# Patient Record
Sex: Male | Born: 1995 | Race: Black or African American | Hispanic: No | Marital: Single | Smoking: Former smoker
Health system: Southern US, Community
[De-identification: ages and names within clinical notes are randomized; demographics above are authoritative.]

## PROBLEM LIST (undated history)

## (undated) DIAGNOSIS — G43909 Migraine, unspecified, not intractable, without status migrainosus: Secondary | ICD-10-CM

## (undated) DIAGNOSIS — G4700 Insomnia, unspecified: Secondary | ICD-10-CM

## (undated) DIAGNOSIS — Z8489 Family history of other specified conditions: Secondary | ICD-10-CM

## (undated) HISTORY — PX: WISDOM TOOTH EXTRACTION: SHX21

## (undated) HISTORY — PX: TYMPANOSTOMY TUBE PLACEMENT: SHX32

---
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
## 2015-11-05 ENCOUNTER — Encounter (HOSPITAL_COMMUNITY): Payer: Self-pay | Admitting: Emergency Medicine

## 2015-11-05 ENCOUNTER — Emergency Department (HOSPITAL_COMMUNITY)
Admission: EM | Admit: 2015-11-05 | Discharge: 2015-11-06 | Disposition: A | Discharge status: Home / Self Care | Payer: BC Managed Care – PPO | Attending: Emergency Medicine | Admitting: Emergency Medicine

## 2015-11-05 DIAGNOSIS — S060X9A Concussion with loss of consciousness of unspecified duration, initial encounter: Secondary | ICD-10-CM | POA: Insufficient documentation

## 2015-11-05 DIAGNOSIS — W01198A Fall on same level from slipping, tripping and stumbling with subsequent striking against other object, initial encounter: Secondary | ICD-10-CM | POA: Insufficient documentation

## 2015-11-05 DIAGNOSIS — Z8679 Personal history of other diseases of the circulatory system: Secondary | ICD-10-CM | POA: Diagnosis not present

## 2015-11-05 DIAGNOSIS — Y9289 Other specified places as the place of occurrence of the external cause: Secondary | ICD-10-CM | POA: Insufficient documentation

## 2015-11-05 DIAGNOSIS — R000 Tachycardia, unspecified: Secondary | ICD-10-CM | POA: Insufficient documentation

## 2015-11-05 DIAGNOSIS — Y9389 Activity, other specified: Secondary | ICD-10-CM | POA: Diagnosis not present

## 2015-11-05 DIAGNOSIS — W19XXXA Unspecified fall, initial encounter: Secondary | ICD-10-CM

## 2015-11-05 DIAGNOSIS — Y998 Other external cause status: Secondary | ICD-10-CM | POA: Insufficient documentation

## 2015-11-05 DIAGNOSIS — F1310 Sedative, hypnotic or anxiolytic abuse, uncomplicated: Secondary | ICD-10-CM | POA: Insufficient documentation

## 2015-11-05 DIAGNOSIS — S0990XA Unspecified injury of head, initial encounter: Secondary | ICD-10-CM | POA: Diagnosis present

## 2015-11-05 DIAGNOSIS — F1210 Cannabis abuse, uncomplicated: Secondary | ICD-10-CM | POA: Diagnosis not present

## 2015-11-05 LAB — CBG MONITORING, ED: Glucose-Capillary: 107 mg/dL — ABNORMAL HIGH (ref 65–99)

## 2015-11-05 NOTE — ED Notes (Signed)
Pt states he fell about 2 hrs ago and hit his head  Pt states he was very dizzy when it first happened and now his mother states he has been disoriented and his equilibrium has been off causing him to walk into things  Pt states he tripped over a root and fell but is not sure what he hit his head on

## 2015-11-05 NOTE — ED Provider Notes (Signed)
CSN: [id number]     Arrival date & time 11/05/15  2205 History  By signing my name below, I, Tanda Rockers, attest that this documentation has been prepared under the direction and in the presence of Marlon Pel, PA-C. Electronically Signed: Tanda Rockers, ED Scribe. 11/05/2015. 11:55 PM.   Chief Complaint  Patient presents with  . Fall   LEVEL 5 CAVEAT pt is altered  The history is provided by the patient. No language interpreter was used.    HPI Comments: RION CATALA is a 20 y.o. male who presents to the Emergency Department for ground level fall that occurred earlier today. Pt is unsure what time it occurred but estimates around 3-4 PM (approximately 9 hours ago). He states that he tripped and fell, hitting the right side of his head on a tree stump. He endorses possible LOC but is not sure. He states that his friend's told him that he had delayed response after hitting his head. Family mentions that pt is having slurred speech and is very confused. He is taking an abnormally long time to recall events.  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 Pt does mention that he is sleep deprived from migraine headaches and attributes this to his poor memory tonight. No obvious injury to scalp. Pt denies that he uses drugs or alcohol. Denies SI/HI. Mom said she is aware that he has had migraines and problems with sleeping ongoing for a long time.  Past Medical History  Diagnosis Date  . Migraines    Past Surgical History  Procedure Laterality Date  . Tympanostomy tube placement    . Wisdom tooth extraction     Family History  Problem Relation Age of Onset  . Hypertension Other    Social History  Substance Use Topics  . Smoking status: Never Smoker   . Smokeless tobacco: None  . Alcohol Use: No    Review of Systems  Unable to perform ROS: Mental status change   Allergies  Review of patient's  allergies indicates no known allergies.  Home Medications   Prior to Admission medications   Medication Sig Start Date End Date Taking? Authorizing Provider  ibuprofen (ADVIL,MOTRIN) 600 MG tablet Take 600 mg by mouth every 6 (six) hours as needed for headache.  08/27/15  Yes Historical Provider, MD   BP 126/75 mmHg  Pulse 74  Temp(Src) 97.9 F (36.6 C) (Oral)  Resp 20  SpO2 98%   Physical Exam  Constitutional: He appears well-developed and well-nourished. No distress.  HENT:  Head: Normocephalic and atraumatic.  Eyes: Conjunctivae and EOM are normal.  Neck: Neck supple. No tracheal deviation present.  Cardiovascular: Tachycardia present.   Pulmonary/Chest: Effort normal. No respiratory distress.  Musculoskeletal: Normal range of motion.  Neurological: He is alert. He displays no atrophy. No cranial nerve deficit or sensory deficit.  When asked when pt's birthday is it takes him 3 times to state the proper birthday. When asked to remember the phrases "blue, basketball, and 16" but recalls "blue and telephone."  Skin: Skin is warm and dry.  Psychiatric: He has a normal mood and affect. His behavior is normal.  Nursing note and vitals reviewed.   ED Course  Procedures (including critical care time)  DIAGNOSTIC STUDIES: Oxygen Saturation is 100% on RA, normal by my interpretation.    COORDINATION OF CARE: 11:50 PM-Discussed treatment plan which includes CT Head with pt  at bedside and pt agreed to plan.   Labs Review Labs Reviewed  URINE RAPID DRUG SCREEN, HOSP PERFORMED - Abnormal; Notable for the following:    Benzodiazepines POSITIVE (*)    Tetrahydrocannabinol POSITIVE (*)    All other components within normal limits  CBC WITH DIFFERENTIAL/PLATELET - Abnormal; Notable for the following:    WBC 10.6 (*)    RBC 4.19 (*)    Hemoglobin 12.6 (*)    HCT 37.8 (*)    Neutro Abs 7.9 (*)    All other components within normal limits  BASIC METABOLIC PANEL - Abnormal; Notable  for the following:    Glucose, Bld 114 (*)    All other components within normal limits  CBG MONITORING, ED - Abnormal; Notable for the following:    Glucose-Capillary 107 (*)    All other components within normal limits  ETHANOL    Imaging Review Ct Head Wo Contrast  11/06/2015  CLINICAL DATA:  20 year old male who with migraines. Patient reports recent fall. EXAM: CT HEAD WITHOUT CONTRAST CT CERVICAL SPINE WITHOUT CONTRAST TECHNIQUE: Multidetector CT imaging of the head and cervical spine was performed following the standard protocol without intravenous contrast. Multiplanar CT image reconstructions of the cervical spine were also generated. COMPARISON:  None. FINDINGS: CT HEAD FINDINGS The ventricles and the sulci are appropriate in size for the patient's age. There is no intracranial hemorrhage. No midline shift or mass effect identified. The gray-white matter differentiation is preserved. The visualized paranasal sinuses and mastoid air cells are well aerated. The calvarium is intact. CT CERVICAL SPINE FINDINGS There is no acute fracture or subluxation of the cervical spine.The intervertebral disc spaces are preserved.The odontoid and spinous processes are intact.There is normal anatomic alignment of the C1-C2 lateral masses. The visualized soft tissues appear unremarkable. IMPRESSION: No acute intracranial pathology. No acute/ traumatic cervical spine pathology. Electronically Signed   By: Elgie Collard M.D.   On: 11/06/2015 00:19   Ct Cervical Spine Wo Contrast  11/06/2015  CLINICAL DATA:  20 year old male who with migraines. Patient reports recent fall. EXAM: CT HEAD WITHOUT CONTRAST CT CERVICAL SPINE WITHOUT CONTRAST TECHNIQUE: Multidetector CT imaging of the head and cervical spine was performed following the standard protocol without intravenous contrast. Multiplanar CT image reconstructions of the cervical spine were also generated. COMPARISON:  None. FINDINGS: CT HEAD FINDINGS The  ventricles and the sulci are appropriate in size for the patient's age. There is no intracranial hemorrhage. No midline shift or mass effect identified. The gray-white matter differentiation is preserved. The visualized paranasal sinuses and mastoid air cells are well aerated. The calvarium is intact. CT CERVICAL SPINE FINDINGS There is no acute fracture or subluxation of the cervical spine.The intervertebral disc spaces are preserved.The odontoid and spinous processes are intact.There is normal anatomic alignment of the C1-C2 lateral masses. The visualized soft tissues appear unremarkable. IMPRESSION: No acute intracranial pathology. No acute/ traumatic cervical spine pathology. Electronically Signed   By: Elgie Collard M.D.   On: 11/06/2015 00:19   I have personally reviewed and evaluated these images and lab results as part of my medical decision-making.   EKG Interpretation None      MDM   Final diagnoses:  Fall, initial encounter  Concussion, with loss of consciousness of unspecified duration, initial encounter   Patient back to baseline at 2:30 am Drug screen has come back showing positive Benzos and Marijuana, The patient admits to not long before the fall smoking a few bowls of marijuana and  taking a bar of Xanax for the first time which he used because him and his friend were bored.  He then fell and went home and his mom was concerned of his decrease memory and slurring his speech, he now has no deficits. He requests I don't tell his mother who is with him. Results and conversation was had after asking grandpa and mother to step out of the room.  Patient encouraged to dc using drugs.Clinically patient may still have some mild concussion symptoms. Given strict return precautions.  Filed Vitals:   11/06/15 0046 11/06/15 0248  BP: 134/78 126/75  Pulse: 82 74  Temp:    Resp: 18 20     I personally performed the services described in this documentation, which was scribed in my  presence. The recorded information has been reviewed and is accurate.      Marlon Peliffany Makenzee Choudhry, PA-C 11/06/15 0254  Dione Boozeavid Glick, MD 11/06/15 [phone]

## 2015-11-06 ENCOUNTER — Emergency Department (HOSPITAL_COMMUNITY): Payer: BC Managed Care – PPO

## 2015-11-06 LAB — CBC WITH DIFFERENTIAL/PLATELET
Basophils Absolute: 0 10*3/uL (ref 0.0–0.1)
Basophils Relative: 0 %
Eosinophils Absolute: 0.2 10*3/uL (ref 0.0–0.7)
Eosinophils Relative: 2 %
HCT: 37.8 % — ABNORMAL LOW (ref 39.0–52.0)
Hemoglobin: 12.6 g/dL — ABNORMAL LOW (ref 13.0–17.0)
Lymphocytes Relative: 18 %
Lymphs Abs: 1.9 10*3/uL (ref 0.7–4.0)
MCH: 30.1 pg (ref 26.0–34.0)
MCHC: 33.3 g/dL (ref 30.0–36.0)
MCV: 90.2 fL (ref 78.0–100.0)
Monocytes Absolute: 0.6 10*3/uL (ref 0.1–1.0)
Monocytes Relative: 6 %
Neutro Abs: 7.9 10*3/uL — ABNORMAL HIGH (ref 1.7–7.7)
Neutrophils Relative %: 74 %
Platelets: 283 10*3/uL (ref 150–400)
RBC: 4.19 MIL/uL — ABNORMAL LOW (ref 4.22–5.81)
RDW: 13.8 % (ref 11.5–15.5)
WBC: 10.6 10*3/uL — ABNORMAL HIGH (ref 4.0–10.5)

## 2015-11-06 LAB — BASIC METABOLIC PANEL
Anion gap: 9 (ref 5–15)
BUN: 11 mg/dL (ref 6–20)
CO2: 26 mmol/L (ref 22–32)
Calcium: 9.3 mg/dL (ref 8.9–10.3)
Chloride: 103 mmol/L (ref 101–111)
Creatinine, Ser: 0.82 mg/dL (ref 0.61–1.24)
GFR calc Af Amer: >60 mL/min (ref >60)
GFR calc non Af Amer: >60 mL/min (ref >60)
Glucose, Bld: 114 mg/dL — ABNORMAL HIGH (ref 65–99)
Potassium: 3.7 mmol/L (ref 3.5–5.1)
Sodium: 138 mmol/L (ref 135–145)

## 2015-11-06 LAB — RAPID URINE DRUG SCREEN, HOSP PERFORMED
Amphetamines: NOT DETECTED
Barbiturates: NOT DETECTED
Benzodiazepines: POSITIVE — AB
Cocaine: NOT DETECTED
Opiates: NOT DETECTED
Tetrahydrocannabinol: POSITIVE — AB

## 2015-11-06 LAB — ETHANOL: Alcohol, Ethyl (B): <5 mg/dL (ref <5)

## 2015-11-06 NOTE — Discharge Instructions (Signed)
Concussion, Adult A concussion, or closed-head injury, is a brain injury caused by a direct blow to the head or by a quick and sudden movement (jolt) of the head or neck. Concussions are usually not life-threatening. Even so, the effects of a concussion can be serious. If you have had a concussion before, you are more likely to experience concussion-like symptoms after a direct blow to the head.  CAUSES  Direct blow to the head, such as from running into another player during a soccer game, being hit in a fight, or hitting your head on a hard surface.  A jolt of the head or neck that causes the brain to move back and forth inside the skull, such as in a car crash. SIGNS AND SYMPTOMS The signs of a concussion can be hard to notice. Early on, they may be missed by you, family members, and health care providers. You may look fine but act or feel differently. Symptoms are usually temporary, but they may last for days, weeks, or even longer. Some symptoms may appear right away while others may not show up for hours or days. Every head injury is different. Symptoms include:  Mild to moderate headaches that will not go away.  A feeling of pressure inside your head.  Having more trouble than usual:  Learning or remembering things you have heard.  Answering questions.  Paying attention or concentrating.  Organizing daily tasks.  Making decisions and solving problems.  Slowness in thinking, acting or reacting, speaking, or reading.  Getting lost or being easily confused.  Feeling tired all the time or lacking energy (fatigued).  Feeling drowsy.  Sleep disturbances.  Sleeping more than usual.  Sleeping less than usual.  Trouble falling asleep.  Trouble sleeping (insomnia).  Loss of balance or feeling lightheaded or dizzy.  Nausea or vomiting.  Numbness or tingling.  Increased sensitivity to:  Sounds.  Lights.  Distractions.  Vision problems or eyes that tire  easily.  Diminished sense of taste or smell.  Ringing in the ears.  Mood changes such as feeling sad or anxious.  Becoming easily irritated or angry for little or no reason.  Lack of motivation.  Seeing or hearing things other people do not see or hear (hallucinations). DIAGNOSIS Your health care provider can usually diagnose a concussion based on a description of your injury and symptoms. He or she will ask whether you passed out (lost consciousness) and whether you are having trouble remembering events that happened right before and during your injury. Your evaluation might include:  A brain scan to look for signs of injury to the brain. Even if the test shows no injury, you may still have a concussion.  Blood tests to be sure other problems are not present. TREATMENT  Concussions are usually treated in an emergency department, in urgent care, or at a clinic. You may need to stay in the hospital overnight for further treatment.  Tell your health care provider if you are taking any medicines, including prescription medicines, over-the-counter medicines, and natural remedies. Some medicines, such as blood thinners (anticoagulants) and aspirin, may increase the chance of complications. Also tell your health care provider whether you have had alcohol or are taking illegal drugs. This information may affect treatment.  Your health care provider will send you home with important instructions to follow.  How fast you will recover from a concussion depends on many factors. These factors include how severe your concussion is, what part of your brain was injured,  your age, and how healthy you were before the concussion. °· Most people with mild injuries recover fully. Recovery can take time. In general, recovery is slower in older persons. Also, persons who have had a concussion in the past or have other medical problems may find that it takes longer to recover from their current injury. °HOME  CARE INSTRUCTIONS °General Instructions °· Carefully follow the directions your health care provider gave you. °· Only take over-the-counter or prescription medicines for pain, discomfort, or fever as directed by your health care provider. °· Take only those medicines that your health care provider has approved. °· Do not drink alcohol until your health care provider says you are well enough to do so. Alcohol and certain other drugs may slow your recovery and can put you at risk of further injury. °· If it is harder than usual to remember things, write them down. °· If you are easily distracted, try to do one thing at a time. For example, do not try to watch TV while fixing dinner. °· Talk with family members or close friends when making important decisions. °· Keep all follow-up appointments. Repeated evaluation of your symptoms is recommended for your recovery. °· Watch your symptoms and tell others to do the same. Complications sometimes occur after a concussion. Older adults with a brain injury may have a higher risk of serious complications, such as a blood clot on the brain. °· Tell your teachers, school nurse, school counselor, coach, athletic trainer, or work manager about your injury, symptoms, and restrictions. Tell them about what you can or cannot do. They should watch for: °¨ Increased problems with attention or concentration. °¨ Increased difficulty remembering or learning new information. °¨ Increased time needed to complete tasks or assignments. °¨ Increased irritability or decreased ability to cope with stress. °¨ Increased symptoms. °· Rest. Rest helps the brain to heal. Make sure you: °¨ Get plenty of sleep at night. Avoid staying up late at night. °¨ Keep the same bedtime hours on weekends and weekdays. °¨ Rest during the day. Take daytime naps or rest breaks when you feel tired. °· Limit activities that require a lot of thought or concentration. These include: °¨ Doing homework or job-related  work. °¨ Watching TV. °¨ Working on the computer. °· Avoid any situation where there is potential for another head injury (football, hockey, soccer, basketball, martial arts, downhill snow sports and horseback riding). Your condition will get worse every time you experience a concussion. You should avoid these activities until you are evaluated by the appropriate follow-up health care providers. °Returning To Your Regular Activities °You will need to return to your normal activities slowly, not all at once. You must give your body and brain enough time for recovery. °· Do not return to sports or other athletic activities until your health care provider tells you it is safe to do so. °· Ask your health care provider when you can drive, ride a bicycle, or operate heavy machinery. Your ability to react may be slower after a brain injury. Never do these activities if you are dizzy. °· Ask your health care provider about when you can return to work or school. °Preventing Another Concussion °It is very important to avoid another brain injury, especially before you have recovered. In rare cases, another injury can lead to permanent brain damage, brain swelling, or death. The risk of this is greatest during the first 7-10 days after a head injury. Avoid injuries by: °· Wearing a   seat belt when riding in a car.  Drinking alcohol only in moderation.  Wearing a helmet when biking, skiing, skateboarding, skating, or doing similar activities.  Avoiding activities that could lead to a second concussion, such as contact or recreational sports, until your health care provider says it is okay.  Taking safety measures in your home.  Remove clutter and tripping hazards from floors and stairways.  Use grab bars in bathrooms and handrails by stairs.  Place non-slip mats on floors and in bathtubs.  Improve lighting in dim areas. SEEK MEDICAL CARE IF:  You have increased problems paying attention or  concentrating.  You have increased difficulty remembering or learning new information.  You need more time to complete tasks or assignments than before.  You have increased irritability or decreased ability to cope with stress.  You have more symptoms than before. Seek medical care if you have any of the following symptoms for more than 2 weeks after your injury:  Lasting (chronic) headaches.  Dizziness or balance problems.  Nausea.  Vision problems.  Increased sensitivity to noise or light.  Depression or mood swings.  Anxiety or irritability.  Memory problems.  Difficulty concentrating or paying attention.  Sleep problems.  Feeling tired all the time. SEEK IMMEDIATE MEDICAL CARE IF:  You have severe or worsening headaches. These may be a sign of a blood clot in the brain.  You have weakness (even if only in one hand, leg, or part of the face).  You have numbness.  You have decreased coordination.  You vomit repeatedly.  You have increased sleepiness.  One pupil is larger than the other.  You have convulsions.  You have slurred speech.  You have increased confusion. This may be a sign of a blood clot in the brain.  You have increased restlessness, agitation, or irritability.  You are unable to recognize people or places.  You have neck pain.  It is difficult to wake you up.  You have unusual behavior changes.  You lose consciousness. MAKE SURE YOU: Head Injury, Adult You have received a head injury. It does not appear serious at this time. Headaches and vomiting are common following head injury. It should be easy to awaken from sleeping. Sometimes it is necessary for you to stay in the emergency department for a while for observation. Sometimes admission to the hospital may be needed. After injuries such as yours, most problems occur within the first 24 hours, but side effects may occur up to 7-10 days after the injury. It is important for you to  carefully monitor your condition and contact your health care provider or seek immediate medical care if there is a change in your condition. WHAT ARE THE TYPES OF HEAD INJURIES? Head injuries can be as minor as a bump. Some head injuries can be more severe. More severe head injuries include:  A jarring injury to the brain (concussion).  A bruise of the brain (contusion). This mean there is bleeding in the brain that can cause swelling.  A cracked skull (skull fracture).  Bleeding in the brain that collects, clots, and forms a bump (hematoma). WHAT CAUSES A HEAD INJURY? A serious head injury is most likely to happen to someone who is in a car wreck and is not wearing a seat belt. Other causes of major head injuries include bicycle or motorcycle accidents, sports injuries, and falls. HOW ARE HEAD INJURIES DIAGNOSED? A complete history of the event leading to the injury and your current symptoms  will be helpful in diagnosing head injuries. Many times, pictures of the brain, such as CT or MRI are needed to see the extent of the injury. Often, an overnight hospital stay is necessary for observation.  WHEN SHOULD I SEEK IMMEDIATE MEDICAL CARE?  You should get help right away if:  You have confusion or drowsiness.  You feel sick to your stomach (nauseous) or have continued, forceful vomiting.  You have dizziness or unsteadiness that is getting worse.  You have severe, continued headaches not relieved by medicine. Only take over-the-counter or prescription medicines for pain, fever, or discomfort as directed by your health care provider.  You do not have normal function of the arms or legs or are unable to walk.  You notice changes in the black spots in the center of the colored part of your eye (pupil).  You have a clear or bloody fluid coming from your nose or ears.  You have a loss of vision. During the next 24 hours after the injury, you must stay with someone who can watch you for the  warning signs. This person should contact local emergency services (911 in the [place] if you have seizures, you become unconscious, or you are unable to wake up. HOW CAN I PREVENT A HEAD INJURY IN THE FUTURE? The most important factor for preventing major head injuries is avoiding motor vehicle accidents. To minimize the potential for damage to your head, it is crucial to wear seat belts while riding in motor vehicles. Wearing helmets while bike riding and playing collision sports (like football) is also helpful. Also, avoiding dangerous activities around the house will further help reduce your risk of head injury.  WHEN CAN I RETURN TO NORMAL ACTIVITIES AND ATHLETICS? You should be reevaluated by your health care provider before returning to these activities. If you have any of the following symptoms, you should not return to activities or contact sports until 1 week after the symptoms have stopped:  Persistent headache.  Dizziness or vertigo.  Poor attention and concentration.  Confusion.  Memory problems.  Nausea or vomiting.  Fatigue or tire easily.  Irritability.  Intolerant of bright lights or loud noises.  Anxiety or depression.  Disturbed sleep. MAKE SURE YOU:   Understand these instructions.  Will watch your condition.  Will get help right away if you are not doing well or get worse.   This information is not intended to replace advice given to you by your health care provider. Make sure you discuss any questions you have with your health care provider.   Document Released: 06/13/2005 Document Revised: 07/04/2014 Document Reviewed: 02/18/2013 Elsevier Interactive Patient Education [employer]   Understand these instructions.  Will watch your condition.  Will get help right away if you are not doing well or get worse.   This information is not intended to replace advice given to you by your health care provider. Make sure you discuss any questions you  have with your health care provider.   Document Released: 09/03/2003 Document Revised: 07/04/2014 Document Reviewed: 01/03/2013 Elsevier Interactive Patient Education [employer]

## 2015-11-06 NOTE — ED Notes (Signed)
Pt made aware of need for urine sample. Pt stated he cannot urinate at this time.

## 2016-11-19 ENCOUNTER — Encounter (HOSPITAL_COMMUNITY): Payer: Self-pay | Admitting: Emergency Medicine

## 2016-11-19 ENCOUNTER — Emergency Department (HOSPITAL_COMMUNITY)
Admission: EM | Admit: 2016-11-19 | Discharge: 2016-11-19 | Disposition: A | Discharge status: Home / Self Care | Payer: BC Managed Care – PPO | Attending: Emergency Medicine | Admitting: Emergency Medicine

## 2016-11-19 ENCOUNTER — Emergency Department (HOSPITAL_COMMUNITY): Payer: BC Managed Care – PPO

## 2016-11-19 DIAGNOSIS — S91331A Puncture wound without foreign body, right foot, initial encounter: Secondary | ICD-10-CM | POA: Diagnosis not present

## 2016-11-19 DIAGNOSIS — S99921A Unspecified injury of right foot, initial encounter: Secondary | ICD-10-CM | POA: Diagnosis present

## 2016-11-19 DIAGNOSIS — W268XXA Contact with other sharp object(s), not elsewhere classified, initial encounter: Secondary | ICD-10-CM | POA: Diagnosis not present

## 2016-11-19 DIAGNOSIS — Y929 Unspecified place or not applicable: Secondary | ICD-10-CM | POA: Diagnosis not present

## 2016-11-19 DIAGNOSIS — Y999 Unspecified external cause status: Secondary | ICD-10-CM | POA: Diagnosis not present

## 2016-11-19 DIAGNOSIS — L03115 Cellulitis of right lower limb: Secondary | ICD-10-CM | POA: Diagnosis not present

## 2016-11-19 DIAGNOSIS — Z23 Encounter for immunization: Secondary | ICD-10-CM | POA: Insufficient documentation

## 2016-11-19 DIAGNOSIS — Y939 Activity, unspecified: Secondary | ICD-10-CM | POA: Diagnosis not present

## 2016-11-19 MED ORDER — CIPROFLOXACIN HCL 500 MG PO TABS: 500.0000 mg | ORAL_TABLET | Freq: Two times a day (BID) | ORAL | 0 refills | Status: DC

## 2016-11-19 MED ORDER — LIDOCAINE-EPINEPHRINE (PF) 2 %-1:200000 IJ SOLN
10.0000 mL | Freq: Once | INTRAMUSCULAR | Status: DC
  Filled 2016-11-19: qty 20

## 2016-11-19 MED ORDER — TETANUS-DIPHTH-ACELL PERTUSSIS 5-2.5-18.5 LF-MCG/0.5 IM SUSP
0.5000 mL | Freq: Once | INTRAMUSCULAR | Status: AC
  Administered 2016-11-19: 0.5 mL via INTRAMUSCULAR
  Filled 2016-11-19: qty 0.5

## 2016-11-19 NOTE — ED Notes (Signed)
Pt reports that he stepped on a rusty nail at his job. Pt states the foot hurts to walk on and throbs. Pt has a small spot on the rt foot where the nail went in and a circle of red around the area. Pt denies any puss or other drainage from the puncture site.

## 2016-11-19 NOTE — ED Provider Notes (Signed)
[facility]-EMERGENCY DEPT Provider Note   CSN: [id number] Arrival date & time: 11/19/16  1638  By signing my name below, I, Kyle Hardy, attest that this documentation has been prepared under the direction and in the presence of [address] Kyle Hardy, [place] Electronically Signed: Deland PrettySherilynn Hardy, ED Scribe. 11/19/16. 6:41 PM.  History   Chief Complaint Chief Complaint  Patient presents with  . Foot Pain   The history is provided by the patient and medical records. No language interpreter was used.  Foot Pain  This is a new problem. The current episode started 2 days ago. The problem occurs constantly. The problem has been gradually worsening. Pertinent negatives include no chest pain, no abdominal pain and no shortness of breath. The symptoms are aggravated by standing and walking. The symptoms are relieved by NSAIDs. Treatments tried: ibuprofen. The treatment provided moderate relief.    HPI Comments: Kyle Hardy is an otherwise healthy 21 y.o. male who presents to the Emergency Department complaining of R foot pain after he accidentally stepped on a nail while working on a porch 2 days ago. States that the nail was rusty. He had actually previously stepped on another nail several days before that, and it had healed just fine, so he thought this one would do the same. States he was wearing sneakers when he stepped on the nail. States that over the last two days, his pain has worsened, and he developed swelling, redness, and warmth to the puncture wound on the R foot. He describes his pain as 3/10 intermittent "throbbing" nonradiating plantar right foot pain at the site of the wound, worse with walking/standing, and moderately improved with ibuprofen and using neosporin to the wound. Reports associated redness, warmth, and swelling at the site. Pt denies drainage at the site, red streaking, fevers, chills, chest pain, SOB, abdominal pain, constipation, diarrhea, nausea, vomting, dysuria, hematuria,  arthralgias, numbness, tingling, focal weakness, or any other complaints at this time. His last Tetanus was 2579yrs ago.   Past Medical History:  Diagnosis Date  . Migraines     There are no active problems to display for this patient.   Past Surgical History:  Procedure Laterality Date  . TYMPANOSTOMY TUBE PLACEMENT    . WISDOM TOOTH EXTRACTION         Home Medications    Prior to Admission medications   Medication Sig Start Date End Date Taking? Authorizing Provider  ibuprofen (ADVIL,MOTRIN) 600 MG tablet Take 600 mg by mouth every 6 (six) hours as needed for headache.  08/27/15   [provider]    Family History Family History  Problem Relation Age of Onset  . Hypertension Other     Social History Social History  Substance Use Topics  . Smoking status: Never Smoker  . Smokeless tobacco: Not on file  . Alcohol use No     Allergies   Patient has no known allergies.   Review of Systems Review of Systems  Constitutional: Negative for chills and fever.  Respiratory: Negative for shortness of breath.   Cardiovascular: Negative for chest pain.  Gastrointestinal: Negative for abdominal pain, constipation, diarrhea, nausea and vomiting.  Genitourinary: Negative for dysuria and hematuria.  Musculoskeletal: Positive for joint swelling and myalgias. Negative for arthralgias.  Skin: Positive for color change and wound.  Allergic/Immunologic: Negative for immunocompromised state.  Neurological: Negative for weakness and numbness.  Psychiatric/Behavioral: Negative for confusion.   All other systems reviewed and are negative for acute change except as noted in the HPI.  Physical Exam Updated Vital Signs BP 130/90 (BP Location: Left Arm)   Pulse (!) 101   Temp 98 F (36.7 C) (Oral)   Resp 14   Ht 5\' 8"  (1.727 m)   Wt 160 lb (72.6 kg)   SpO2 99%   BMI 24.33 kg/m   Physical Exam  Constitutional: He is oriented to person, place, and time. Vital signs  are normal. He appears well-developed and well-nourished.  Non-toxic appearance. No distress.  Afebrile, nontoxic, NAD  HENT:  Head: Normocephalic and atraumatic.  Mouth/Throat: Mucous membranes are normal.  Eyes: Conjunctivae and EOM are normal. Right eye exhibits no discharge. Left eye exhibits no discharge.  Neck: Normal range of motion. Neck supple.  Cardiovascular: Normal rate and intact distal pulses.   Pulmonary/Chest: Effort normal. No respiratory distress.  Abdominal: Normal appearance. He exhibits no distension.  Musculoskeletal: Normal range of motion.  See skin exam below  Neurological: He is alert and oriented to person, place, and time. He has normal strength. No sensory deficit.  Skin: Skin is warm, dry and intact. No rash noted. There is erythema.  Plantar aspect of the right foot with mild erythema surrounding a small puncture wound, no drainage, mildly warm to the touch, and mildly swollen; no fluctuance or induration of the tissues. Mild TTP to this area, but otherwise no focal bony TTP of the foot or ankle. No red streaking. Strength and sensation grossly intact, distal pulses intact, compartments soft.  Psychiatric: He has a normal mood and affect.  Nursing note and vitals reviewed.    ED Treatments / Results   DIAGNOSTIC STUDIES: Oxygen Saturation is 99% on RA, normal by my interpretation.   COORDINATION OF CARE: 6:00 PM-Discussed next steps with pt. Pt verbalized understanding and is agreeable with the plan.   Labs (all labs ordered are listed, but only abnormal results are displayed) Labs Reviewed - No data to display  EKG  EKG Interpretation None       Radiology Dg Foot Complete Right  Result Date: 11/19/2016 CLINICAL DATA:  Stepped on a rusty nail 4 days ago, initial encounter EXAM: RIGHT FOOT COMPLETE - 3+ VIEW COMPARISON:  None. FINDINGS: No acute fracture or dislocation is noted. No radiopaque foreign body is seen. Mild soft tissue swelling is  noted inferiorly consistent with the given history. IMPRESSION: No acute bony abnormality noted. Mild soft tissues changes are seen without definitive radiopaque foreign body. Electronically Signed   By: Alcide Clever M.D.   On: 11/19/2016 18:18    Procedures .Marland KitchenIncision and Drainage Date/Time: 11/19/2016 7:32 PM Performed by: Rhona Raider Authorized by: Rhona Raider   Consent:    Consent obtained:  Verbal   Consent given by:  Patient   Risks discussed:  Incomplete drainage, pain and bleeding   Alternatives discussed:  Alternative treatment, referral and observation Location:    Indications for incision and drainage: puncture wound.   Location:  Lower extremity   Lower extremity location:  Foot   Foot location:  R foot Pre-procedure details:    Skin preparation:  Betadine Anesthesia (see MAR for exact dosages):    Anesthesia method:  Local infiltration   Local anesthetic:  Lidocaine 2% WITH epi Procedure type:    Complexity:  Simple Procedure details:    Needle aspiration: no     Incision types:  Single straight   Incision depth:  Subcutaneous   Scalpel blade:  11   Wound management:  Probed and deloculated, irrigated with saline, extensive cleaning and  debrided   Drainage:  Bloody   Drainage amount:  Scant   Wound treatment:  Wound left open   Packing materials:  None Post-procedure details:    Patient tolerance of procedure:  Tolerated well, no immediate complications    (including critical care time)  Medications Ordered in ED Medications  lidocaine-EPINEPHrine (XYLOCAINE W/EPI) 2 %-1:200000 (PF) injection 10 mL (not administered)  Tdap (BOOSTRIX) injection 0.5 mL (0.5 mLs Intramuscular Given 11/19/16 1810)     Initial Impression / Assessment and Plan / ED Course  I have reviewed the triage vital signs and the nursing notes.  Pertinent labs & imaging results that were available during my care of the patient were reviewed by me and considered in my medical  decision making (see chart for details).     21 y.o. male here with puncture wound to R foot, now with erythema and warmth around the wound. Tdap out of date, will update today. On exam, mild erythema and warmth just around the puncture wound, mild swelling, no drainage, no fluctuance or induration, no red streaking. Will plan for opening wound to I&D and allow for improved drainage of the puncture wound; will obtain xray first to ensure no retained FBs. Pt declined wanting pain meds. Will reassess shortly.   7:50 PM Xray negative aside from mild soft tissue swelling as expected with the cellulitis clinically seen. I&D performed and opened wound adequately to allow for proper drainage. Advised warm compresses/soaks, and wound care advised. Will start on cipro for infection management. Advised F/up with PCP in 2-3 days for recheck of wound. Strict return precautions advised. Tylenol/motrin discussed for pain. I explained the diagnosis and have given explicit precautions to return to the ER including for any other new or worsening symptoms. The patient understands and accepts the medical plan as it's been dictated and I have answered their questions. Discharge instructions concerning home care and prescriptions have been given. The patient is STABLE and is discharged to home in good condition.   I personally performed the services described in this documentation, which was scribed in my presence. The recorded information has been reviewed and is accurate.   Final Clinical Impressions(s) / ED Diagnoses   Final diagnoses:  Puncture wound of right foot, initial encounter  Cellulitis of right foot    New Prescriptions New Prescriptions   CIPROFLOXACIN (CIPRO) 500 MG TABLET    Take 1 tablet (500 mg total) by mouth 2 (two) times daily. One po bid x 7 days     [address] [place], [place] 11/19/16 1951    Doug Sou, MD 11/20/16 [phone]

## 2016-11-19 NOTE — ED Notes (Signed)
Patient transported to X-ray 

## 2016-11-19 NOTE — Discharge Instructions (Signed)
Keep wound clean and dry. Apply warm compresses or perform warm water soaks with 1/2 hydrogen peroxide+1/2 warm water solution (do this at least twice daily) to affected area throughout the day. Take antibiotic until it is finished. Alternate between tylenol and motrin, as needed for pain. Use post op shoe and neosporin with a bandage on the area until it closes up on its own. [facility]-3 days for wound recheck and ongoing management of the wound. Monitor area for signs of infection to include, but not limited to: increasing pain, spreading redness, drainage/pus, worsening swelling, or fevers. Return to emergency department for emergent changing or worsening symptoms.

## 2016-11-19 NOTE — ED Notes (Signed)
Bed: [facility]PT3 Expected date:  Expected time:  Means of arrival:  Comments: 

## 2016-11-19 NOTE — ED Triage Notes (Signed)
Patient reports stepping on a rusty nail at work. C/o pain to right foot with puncture wound. Ambulatory to triage.

## 2017-10-25 ENCOUNTER — Encounter: Payer: Self-pay | Admitting: Neurology

## 2017-10-25 ENCOUNTER — Ambulatory Visit: Payer: BC Managed Care – PPO | Admitting: Neurology

## 2017-10-25 ENCOUNTER — Encounter

## 2017-10-25 ENCOUNTER — Other Ambulatory Visit: Payer: Self-pay

## 2017-10-25 DIAGNOSIS — G43019 Migraine without aura, intractable, without status migrainosus: Secondary | ICD-10-CM | POA: Diagnosis not present

## 2017-10-25 NOTE — Progress Notes (Signed)
Reason for visit: Migraine headache  [name] is a 22 y.o. male  History of present illness:  [name] is a 22 year old right-handed black male with a history of migraine headaches since he was 11 or 22 years old.  His usual headache frequency is about 2 a week, but he was just recently placed on propranolol taking 20 mg twice daily, he is actually only taking 20 mg daily.  The patient has had a good improvement in his headache on the medication, he is now only having about 1 headache a week and the headaches are not severe.  Headaches tend to be bifrontal in nature and may be associated with some blurring of vision and some occasional nausea.  He may have photophobia and phonophobia.  The headaches may last anywhere from 6 hours up to 24 hours.  Imitrex has been helpful at the 50 mg dose.  He does drink a lot of caffeinated products, he has 1 or 2 cups of coffee a day, 1 or 2 glasses of tea a day, and at least one caffeinated soft drink daily.  The patient oftentimes wakes up with headaches, his sleep pattern is quite irregular.  The patient indicates that the occasional triggers are certain odors, but usually there is no obvious initiator for the headache.  The patient denies any neck stiffness, speech alteration, or confusion with the headache.  He denies any neck pain.  He indicates that both parents have migraine headache, he has 2 sisters and one brother, and his siblings do not have any troubles with migraine.  The patient did have a CT scan of the brain done previously in 2017 that was unremarkable.  He does occasionally have some sinus drainage but this is not associated with his headache.  Past Medical History:  Diagnosis Date  . Migraines     Past Surgical History:  Procedure Laterality Date  . TYMPANOSTOMY TUBE PLACEMENT    . WISDOM TOOTH EXTRACTION      Family History  Problem Relation Age of Onset  . Hypertension Other   .  Hypertension Mother   . Cancer Maternal Aunt   . Heart failure Maternal Grandmother   . Heart failure Paternal Grandmother     Social history:  reports that he has quit smoking. He has never used smokeless tobacco. He reports that he drinks alcohol. He reports that he does not use drugs.  Medications:  Prior to Admission medications   Medication Sig Start Date End Date Taking? Authorizing Provider  ibuprofen (ADVIL,MOTRIN) 600 MG tablet Take 600 mg by mouth every 6 (six) hours as needed for headache.  08/27/15  Yes [provider]  propranolol (INDERAL) 20 MG tablet Take 20 mg by mouth daily.    Yes [provider]  SUMAtriptan (IMITREX) 50 MG tablet Take 50 mg by mouth every 2 (two) hours as needed for migraine. May repeat in 2 hours if headache persists or recurs.   Yes [provider]  traZODone (DESYREL) 50 MG tablet Take 50-100 mg by mouth at bedtime.   Yes [provider]     No Known Allergies  ROS:  Out of a complete 14 system review of symptoms, the patient complains only of the following symptoms, and all other reviewed systems are negative.  Headache Not enough sleep, decreased energy, racing thoughts Insomnia  Blood pressure 121/73, pulse 75, height  (1.702 m), weight 182 lb 8 oz (82.8 kg).  Physical Exam  General: The patient is alert and cooperative at the time of the examination.  Eyes: Pupils are equal, round, and reactive to light. Discs are flat bilaterally.  Neck: The neck is supple, no carotid bruits are noted.  Respiratory: The respiratory examination is clear.  Cardiovascular: The cardiovascular examination reveals a regular rate and rhythm, no obvious murmurs or rubs are noted.  Neuromuscular: Range move the cervical spine is full, no crepitus is noted in the temporomandibular joints.  Skin: Extremities are without significant edema.  Neurologic Exam  Mental status: The patient is alert and oriented x 3 at  the time of the examination. The patient has apparent normal recent and remote memory, with an apparently normal attention span and concentration ability.  Cranial nerves: Facial symmetry is present. There is good sensation of the face to pinprick and soft touch bilaterally. The strength of the facial muscles and the muscles to head turning and shoulder shrug are normal bilaterally. Speech is well enunciated, no aphasia or dysarthria is noted. Extraocular movements are full. Visual fields are full. The tongue is midline, and the patient has symmetric elevation of the soft palate. No obvious hearing deficits are noted.  Motor: The motor testing reveals 5 over 5 strength of all 4 extremities. Good symmetric motor tone is noted throughout.  Sensory: Sensory testing is intact to pinprick, soft touch, vibration sensation, and position sense on all 4 extremities. No evidence of extinction is noted.  Coordination: Cerebellar testing reveals good finger-nose-finger and heel-to-shin bilaterally.  Gait and station: Gait is normal. Tandem gait is normal. Romberg is negative. No drift is seen.  Reflexes: Deep tendon reflexes are symmetric and normal bilaterally. Toes are downgoing bilaterally.   CT head and cervical 11/06/15:  IMPRESSION: No acute intracranial pathology.  No acute/ traumatic cervical spine pathology.  * CT scan images were reviewed online. I agree with the written report.    Assessment/Plan:  1.  Common migraine headache  The patient is on a very low dose of propranolol taking 20 mg daily and he has gotten benefit with the medication.  He is to increase the dose to a 20 mg twice daily dose, he will take Imitrex if needed.  He will call for any dose adjustments of the medication.  He will follow-up in 6 months.  [name] MD 10/25/2017 9:26 AM  [facility] [address] [place], [place] [address]  Phone [phone] Fax [phone]

## 2017-11-16 ENCOUNTER — Ambulatory Visit: Payer: BC Managed Care – PPO | Admitting: Podiatry

## 2017-11-23 ENCOUNTER — Ambulatory Visit (INDEPENDENT_AMBULATORY_CARE_PROVIDER_SITE_OTHER): Payer: BC Managed Care – PPO

## 2017-11-23 ENCOUNTER — Encounter: Payer: Self-pay | Admitting: Podiatry

## 2017-11-23 ENCOUNTER — Ambulatory Visit: Payer: BC Managed Care – PPO | Admitting: Podiatry

## 2017-11-23 VITALS — BP 129/88 | HR 95 | Resp 16

## 2017-11-23 DIAGNOSIS — M2142 Flat foot [pes planus] (acquired), left foot: Secondary | ICD-10-CM | POA: Diagnosis not present

## 2017-11-23 DIAGNOSIS — M2141 Flat foot [pes planus] (acquired), right foot: Secondary | ICD-10-CM

## 2017-11-23 NOTE — Progress Notes (Signed)
CMFO: 4* RF eversion b/l, deep heel, m/l flange.   UCBL type device. Everfeet  Drop [id number]

## 2017-11-26 NOTE — Progress Notes (Signed)
  Subjective:  Patient ID: Kyle Hardy, male    DOB: 02/04/1996,  MRN: [id number] HPI Chief Complaint  Patient presents with  . Foot Pain    Medial foot bilateral - flat feet, hurts when walking a lot   . New Patient (Initial Visit)    22 y.o. male presents with the above complaint.   ROS: Denies fever chills nausea vomiting muscle aches pains calf pain back pain chest pain shortness of breath headache.  Past Medical History:  Diagnosis Date  . Migraines    Past Surgical History:  Procedure Laterality Date  . TYMPANOSTOMY TUBE PLACEMENT    . WISDOM TOOTH EXTRACTION      Current Outpatient Medications:  .  ibuprofen (ADVIL,MOTRIN) 600 MG tablet, Take 600 mg by mouth every 6 (six) hours as needed for headache. , Disp: , Rfl: 0 .  propranolol (INDERAL) 20 MG tablet, Take 20 mg by mouth daily. , Disp: , Rfl:  .  SUMAtriptan (IMITREX) 50 MG tablet, Take 50 mg by mouth every 2 (two) hours as needed for migraine. May repeat in 2 hours if headache persists or recurs., Disp: , Rfl:  .  traZODone (DESYREL) 50 MG tablet, Take 50-100 mg by mouth at bedtime., Disp: , Rfl:   No Known Allergies Review of Systems Objective:   Vitals:   11/23/17 1629  BP: 129/88  Pulse: 95  Resp: 16    General: Well developed, nourished, in no acute distress, alert and oriented x3   Dermatological: Skin is warm, dry and supple bilateral. Nails x 10 are well maintained; remaining integument appears unremarkable at this time. There are no open sores, no preulcerative lesions, no rash or signs of infection present.  Vascular: Dorsalis Pedis artery and Posterior Tibial artery pedal pulses are 2/4 bilateral with immedate capillary fill time. Pedal hair growth present. No varicosities and no lower extremity edema present bilateral.   Neruologic: Grossly intact via light touch bilateral. Vibratory intact via tuning fork bilateral. Protective threshold with Semmes Wienstein monofilament intact to all pedal  sites bilateral. Patellar and Achilles deep tendon reflexes 2+ bilateral. No Babinski or clonus noted bilateral.   Musculoskeletal: No gross boney pedal deformities bilateral. No pain, crepitus, or limitation noted with foot and ankle range of motion bilateral. Muscular strength 5/5 in all groups tested bilateral.  With his knee straight he has 0 degrees of dorsiflexion at the level of the ankle.  Moderate to severe pes planus flexible in nature.  Gait: Unassisted, Nonantalgic.    Radiographs:  Radiographs taken today demonstrate moderate to severe pes planus.  No acute findings.  Assessment & Plan:   Assessment: Ankle equinus pes planus.  Plan: Discussed etiology pathology conservative versus surgical therapies at this point we will get him into a set of orthotics.  He was casted today.     Max T. HornbeakHyatt, North DakotaDPM

## 2017-12-14 ENCOUNTER — Other Ambulatory Visit: Payer: BC Managed Care – PPO | Admitting: Orthotics

## 2018-04-24 ENCOUNTER — Ambulatory Visit: Payer: BC Managed Care – PPO | Admitting: Nurse Practitioner

## 2019-07-02 ENCOUNTER — Ambulatory Visit: Payer: BC Managed Care – PPO | Attending: Internal Medicine

## 2019-07-02 DIAGNOSIS — Z20822 Contact with and (suspected) exposure to covid-19: Secondary | ICD-10-CM

## 2019-07-03 LAB — NOVEL CORONAVIRUS, NAA: SARS-CoV-2, NAA: NOT DETECTED

## 2019-10-12 ENCOUNTER — Emergency Department (HOSPITAL_COMMUNITY): Payer: BLUE CROSS/BLUE SHIELD

## 2019-10-12 ENCOUNTER — Encounter (HOSPITAL_COMMUNITY): Payer: Self-pay | Admitting: Emergency Medicine

## 2019-10-12 ENCOUNTER — Other Ambulatory Visit: Payer: Self-pay

## 2019-10-12 ENCOUNTER — Emergency Department (HOSPITAL_COMMUNITY)
Admission: EM | Admit: 2019-10-12 | Discharge: 2019-10-13 | Disposition: A | Discharge status: Home / Self Care | Payer: BLUE CROSS/BLUE SHIELD | Attending: Emergency Medicine | Admitting: Emergency Medicine

## 2019-10-12 DIAGNOSIS — Y999 Unspecified external cause status: Secondary | ICD-10-CM | POA: Insufficient documentation

## 2019-10-12 DIAGNOSIS — W010XXA Fall on same level from slipping, tripping and stumbling without subsequent striking against object, initial encounter: Secondary | ICD-10-CM | POA: Insufficient documentation

## 2019-10-12 DIAGNOSIS — Y929 Unspecified place or not applicable: Secondary | ICD-10-CM | POA: Insufficient documentation

## 2019-10-12 DIAGNOSIS — S82145A Nondisplaced bicondylar fracture of left tibia, initial encounter for closed fracture: Secondary | ICD-10-CM | POA: Diagnosis not present

## 2019-10-12 DIAGNOSIS — Y9344 Activity, trampolining: Secondary | ICD-10-CM | POA: Insufficient documentation

## 2019-10-12 DIAGNOSIS — Z79899 Other long term (current) drug therapy: Secondary | ICD-10-CM | POA: Diagnosis not present

## 2019-10-12 DIAGNOSIS — S82142A Displaced bicondylar fracture of left tibia, initial encounter for closed fracture: Secondary | ICD-10-CM

## 2019-10-12 DIAGNOSIS — Z87891 Personal history of nicotine dependence: Secondary | ICD-10-CM | POA: Insufficient documentation

## 2019-10-12 DIAGNOSIS — S80912A Unspecified superficial injury of left knee, initial encounter: Secondary | ICD-10-CM | POA: Diagnosis present

## 2019-10-12 HISTORY — DX: Insomnia, unspecified: G47.00

## 2019-10-12 MED ORDER — OXYCODONE-ACETAMINOPHEN 5-325 MG PO TABS: 1.0000 | ORAL_TABLET | Freq: Four times a day (QID) | ORAL | 0 refills | Status: DC | PRN

## 2019-10-12 NOTE — Discharge Instructions (Addendum)
Call Dr. Austin Miles office Monday morning for an appointment.

## 2019-10-12 NOTE — ED Provider Notes (Signed)
[facility] EMERGENCY DEPARTMENT Provider Note   CSN: [id number] Arrival date & time: 10/12/19  1629     History Chief Complaint  Patient presents with  . Knee Pain    Kyle Hardy is a 24 y.o. male.  HPI  Patient presents to the emergency department with left knee injury that occurred just prior to arrival.  Patient states that he was jumping on a trampoline when he jumped and went straight off the edge.  He landed directly on his knee and it gave way.  Patient states that he was unable to bear weight after the injury.  Patient states that certain movements palpation make the pain worse.  Patient denies any numbness, weakness, dizziness, headache, blurred vision, back pain or syncope.  Past Medical History:  Diagnosis Date  . Insomnia   . Migraines     Patient Active Problem List   Diagnosis Date Noted  . Common migraine with intractable migraine 10/25/2017    Past Surgical History:  Procedure Laterality Date  . TYMPANOSTOMY TUBE PLACEMENT    . WISDOM TOOTH EXTRACTION         Family History  Problem Relation Age of Onset  . Hypertension Other   . Hypertension Mother   . Cancer Maternal Aunt   . Heart failure Maternal Grandmother   . Heart failure Paternal Grandmother     Social History   Tobacco Use  . Smoking status: Former Research scientist (life sciences)  . Smokeless tobacco: Never Used  Substance Use Topics  . Alcohol use: Yes    Comment: Drinks on the weekends  . Drug use: Yes    Types: Marijuana    Home Medications Prior to Admission medications   Medication Sig Start Date End Date Taking? Authorizing Provider  ibuprofen (ADVIL,MOTRIN) 600 MG tablet Take 600 mg by mouth every 6 (six) hours as needed for headache.  08/27/15   [provider]  propranolol (INDERAL) 20 MG tablet Take 20 mg by mouth daily.     [provider]  SUMAtriptan (IMITREX) 50 MG tablet Take 50 mg by mouth every 2 (two) hours as needed for migraine. May repeat  in 2 hours if headache persists or recurs.    [provider]  traZODone (DESYREL) 50 MG tablet Take 50-100 mg by mouth at bedtime.    [provider]    Allergies    Patient has no known allergies.  Review of Systems   Review of Systems All other systems negative except as documented in the HPI. All pertinent positives and negatives as reviewed in the HPI. Physical Exam Updated Vital Signs BP 133/75 (BP Location: Right Arm)   Pulse (!) 102   Temp 98.9 F (37.2 C) (Oral)   Resp 20   SpO2 100%   Physical Exam Vitals and nursing note reviewed.  Constitutional:      General: He is not in acute distress.    Appearance: He is well-developed.  HENT:     Head: Normocephalic and atraumatic.  Eyes:     Pupils: Pupils are equal, round, and reactive to light.  Pulmonary:     Effort: Pulmonary effort is normal.  Musculoskeletal:     Left knee: Swelling, effusion, ecchymosis and bony tenderness present. Decreased range of motion. Tenderness present.  Skin:    General: Skin is warm and dry.  Neurological:     Mental Status: He is alert and oriented to person, place, and time.     ED Results / Procedures /  Treatments   Labs (all labs ordered are listed, but only abnormal results are displayed) Labs Reviewed - No data to display  EKG None  Radiology CT Knee Left Wo Contrast  Result Date: 10/12/2019 CLINICAL DATA:  Left knee pain since jumping off a trampoline yesterday. Multiple fractures with a large joint effusion on radiographs earlier today. EXAM: CT OF THE LEFT KNEE WITHOUT CONTRAST TECHNIQUE: Multidetector CT imaging of the left knee was performed according to the standard protocol. Multiplanar CT image reconstructions were also generated. COMPARISON:  Left knee radiographs obtained earlier today. FINDINGS: Severely comminuted central tibial fracture involving the tibial spines with multiple fragments displaced into the joint. These include the attachment of  anterior cruciate ligament. There is also a small avulsion fracture off the lateral aspect of the lateral tibial plateau. There is an associated moderate-sized effusion. No additional fractures are seen. IMPRESSION: 1. Severely comminuted central tibial fracture involving the tibial spines with multiple intra-articular fragments, including avulsion of the attachment of the anterior cruciate ligament. 2. Small avulsion fracture off the lateral aspect of the lateral tibial plateau. 3. Moderate-sized effusion. Electronically Signed   By: Beckie Salts M.D.   On: 10/12/2019 18:59   DG Knee Complete 4 Views Left  Result Date: 10/12/2019 CLINICAL DATA:  24 year old male with trauma to the left knee. EXAM: LEFT KNEE - COMPLETE 4+ VIEW COMPARISON:  None FINDINGS: There is a displaced fracture of the posterior aspect of the medial tibial plateau and mildly displaced fracture of the tibial spine. CT may provide better evaluation of these fractures. There is cortical discontinuity of the lateral aspect of the lateral tibial plateau concerning for fracture. There is no dislocation. There is a large suprapatellar effusion. The soft tissues are grossly unremarkable. IMPRESSION: 1. Displaced fractures of the medial tibial plateau and tibial spine as well as probable cortical fracture of the lateral tibial plateau. 2. Large joint effusion. Electronically Signed   By: Elgie Collard M.D.   On: 10/12/2019 17:46    Procedures Procedures (including critical care time)  Medications Ordered in ED Medications - No data to display  ED Course  I have reviewed the triage vital signs and the nursing notes.  Pertinent labs & imaging results that were available during my care of the patient were reviewed by me and considered in my medical decision making (see chart for details).  Clinical Course as of Oct 11 2001  Sat Oct 12, 2019  1851 24 yo healthy male here with mechanical injury to left knee jumping off a trampoline  yesterday. "my leg went one way and my knee went the other when I landed."  Has swelling and tenderness around the left knee.  Hasn't been able to bear weight.  Neurovascularly intact in his distal leg.  We spoke to Dr Susa Simmonds of orthopedics as there appears to be a tibial plateau fracture, he recommended CT imaging, we are awaiting f/u recs.  Anticipate likely discharge home in knee immobilizer with outpatient f/u.  Patient's pain is under control   [MT]    Clinical Course User Index [MT] Trifan, Kermit Balo, MD   MDM Rules/Calculators/A&P                      Spoke with orthopedics and the evaluate the patient and his CT scan imaging.  The best place him in a knee immobilizer and have him follow-up with Dr. Everardo Pacific of sports medicine calling his office for an appointment.  The patient  is stable and will be discharged home.  Patient agrees the plan and all questions were answered.  The patient has normal compartments on examination.  He has normal sensation and pulses in that extremity as well. Final Clinical Impression(s) / ED Diagnoses Final diagnoses:  None    Rx / DC Orders ED Discharge Orders    None       Charlestine Night, PA-C 10/13/19 2359    Terald Sleeper, MD 10/14/19 1217

## 2019-10-12 NOTE — ED Triage Notes (Signed)
C/o L knee pain since he jumped off a trampoline yesterday.

## 2019-10-12 NOTE — Progress Notes (Signed)
Orthopedic Tech Progress Note Patient Details:  Kyle Hardy [birth year] [id number]  Ortho Devices Type of Ortho Device: Knee Immobilizer Ortho Device/Splint Location: lle. pt did not want crutches as he already had some at home. Ortho Device/Splint Interventions: Ordered, Application, Adjustment   Post Interventions Patient Tolerated: Well Instructions Provided: Care of device, Adjustment of device   Trinna Post 10/12/2019, 9:41 PM

## 2019-10-15 ENCOUNTER — Other Ambulatory Visit (HOSPITAL_COMMUNITY)
Admission: RE | Admit: 2019-10-15 | Discharge: 2019-10-15 | Disposition: A | Discharge status: Home / Self Care | Payer: BC Managed Care – PPO | Source: Ambulatory Visit | Attending: Orthopaedic Surgery | Admitting: Orthopaedic Surgery

## 2019-10-15 DIAGNOSIS — Z20822 Contact with and (suspected) exposure to covid-19: Secondary | ICD-10-CM | POA: Insufficient documentation

## 2019-10-15 DIAGNOSIS — Z01812 Encounter for preprocedural laboratory examination: Secondary | ICD-10-CM | POA: Diagnosis not present

## 2019-10-15 LAB — SARS CORONAVIRUS 2 (TAT 6-24 HRS): SARS Coronavirus 2: NEGATIVE

## 2019-10-16 ENCOUNTER — Encounter (HOSPITAL_BASED_OUTPATIENT_CLINIC_OR_DEPARTMENT_OTHER): Payer: Self-pay | Admitting: Orthopaedic Surgery

## 2019-10-16 ENCOUNTER — Other Ambulatory Visit: Payer: Self-pay

## 2019-10-16 NOTE — H&P (Signed)
PREOPERATIVE H&P  Chief Complaint: LEFT ACL TEAR. LATERAL MENISCUS TEAR, TIBIAL PLATEAU FRACTURE  HPI: Kyle Hardy is a 24 y.o. male who is scheduled for LEFT KNEE ARTHROSCOPY WITH ANTERIOR CRUCIATE LIGAMENT (ACL) REPAIR AND TIBIAL PLATEAU BICONDYLAR INTERAL FIXATION KNEE ARTHROSCOPY WITH LATERAL MENISCUS REPAIR.   Patient has a past medical history significant for insomnia.   This is a 24 year old who had an injury when jumping off a trampoline.  His leg folded when it hit the ground.  He had a CT scan, which demonstrated a complex tibial spine avulsion type fracture.  He is having moderate pain.  He is unaccompanied today.  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    His symptoms are rated as moderate to severe, and have been worsening.  This is significantly impairing activities of daily living.    Please see clinic note for further details on this patient's care.    He has elected for surgical management.   Past Medical History:  Diagnosis Date  . Insomnia   . Migraines    Past Surgical History:  Procedure Laterality Date  . TYMPANOSTOMY TUBE PLACEMENT    . WISDOM TOOTH EXTRACTION     Social History   Socioeconomic History  . Marital status: Single    Spouse name: Not on file  . Number of children: 0  . Years of education: 91  . Highest education level: Not on file  Occupational History  . Occupation: [employer]  Tobacco Use  . Smoking status: Former Smoker    Packs/day: 0.25  . Smokeless tobacco: Never Used  Substance and Sexual Activity  . Alcohol use: Yes    Alcohol/week: 2.0 standard drinks    Types: 2 Glasses of wine per week    Comment: Drinks on the weekends  . Drug use: Yes    Types: Marijuana    Comment: twice daily  . Sexual activity: Not on file  Other Topics Concern  . Not on file  Social History Narrative   Lives w/ mother and sister   Caffeine use: Drinks 1 cup per day   Right handed    Social Determinants of Health    Financial Resource Strain:   . Difficulty of Paying Living Expenses:   Food Insecurity:   . Worried About Programme researcher, broadcasting/film/video in the Last Year:   . Barista in the Last Year:   Transportation Needs:   . Freight forwarder (Medical):   Marland Kitchen Lack of Transportation (Non-Medical):   Physical Activity:   . Days of Exercise per Week:   . Minutes of Exercise per Session:   Stress:   . Feeling of Stress :   Social Connections:   . Frequency of Communication with Friends and Family:   . Frequency of Social Gatherings with Friends and Family:   . Attends Religious Services:   . Active Member of Clubs or Organizations:   . Attends Banker Meetings:   Marland Kitchen Marital Status:    Family History  Problem Relation Age of Onset  . Hypertension Other   . Hypertension Mother   . Cancer Maternal Aunt   . Heart failure Maternal Grandmother   . Heart failure Paternal Grandmother    No Known Allergies Prior to Admission medications   Medication Sig Start Date End Date Taking? Authorizing Provider  ibuprofen (ADVIL,MOTRIN) 600 MG tablet Take 600 mg by mouth Hardy 6 (six) hours as needed for headache.  08/27/15  Yes [provider]  oxyCODONE-acetaminophen (PERCOCET/ROXICET) 5-325 MG tablet Take 1 tablet by mouth Hardy 6 (six) hours as needed for severe pain. 10/12/19  Yes Lawyer, Harrell Gave, PA-C  propranolol (INDERAL) 20 MG tablet Take 20 mg by mouth daily.    Yes [provider]  SUMAtriptan (IMITREX) 50 MG tablet Take 50 mg by mouth Hardy 2 (two) hours as needed for migraine. May repeat in 2 hours if headache persists or recurs.   Yes [provider]  traZODone (DESYREL) 50 MG tablet Take 50-100 mg by mouth at bedtime.   Yes [provider]    ROS: All other systems have been reviewed and were otherwise negative with the exception of those mentioned in the HPI and as above.  Physical Exam: General: Alert, no acute distress Cardiovascular: No  pedal edema Respiratory: No cyanosis, no use of accessory musculature GI: No organomegaly, abdomen is soft and non-tender Skin: No lesions in the area of chief complaint Neurologic: Sensation intact distally Psychiatric: Patient is competent for consent with normal mood and affect Lymphatic: No axillary or cervical lymphadenopathy  MUSCULOSKELETAL:  Left knee: ROM is limited secondary to 3+ effusion and pain.  Nontender to palpation about the joint line.  He has pain with attempts at Lachman.  Soft end point on Lachman noted.  He has a warm and well  perfused extremity otherwise.    Imaging: CT and x-ray are reviewed and demonstrate a complex tibial spine avulsion type fracture with involvement of the medial and lateral tibial plateaus, and likely involvement of the meniscus as well.  He has likely an ACL insufficiency from this, PCL seems to be okay based on the position of his tibial spine fracture.    Assessment: LEFT ACL TEAR. LATERAL MENISCUS TEAR, TIBIAL PLATEAU FRACTURE  Plan: Plan for Procedure(s): LEFT KNEE ARTHROSCOPY WITH ANTERIOR CRUCIATE LIGAMENT (ACL) REPAIR AND TIBIAL PLATEAU BICONDYLAR INTERAL FIXATION KNEE ARTHROSCOPY WITH LATERAL MENISCUS REPAIR  The risks benefits and alternatives were discussed with the patient including but not limited to the risks of nonoperative treatment, versus surgical intervention including infection, bleeding, nerve injury,  blood clots, cardiopulmonary complications, morbidity, mortality, among others, and they were willing to proceed.   He understands specific risks, including stiffness, infection, and the need for later ACL reconstruction.  The patient acknowledged the explanation, agreed to proceed with the plan and consent was signed.   Operative Plan: Left knee scope with tibial spine repair, ACL repair versus reconstruction, and lateral meniscus repair versus meniscectomy  Discharge Medications: Standard DVT Prophylaxis:  Aspirin Physical Therapy: Outpatient PT Special Discharge needs: Knee immobilizer (patient should bring Bledsoe brace with him to surgery)   Ethelda Chick, PA-C  10/16/2019 3:47 PM

## 2019-10-16 NOTE — Progress Notes (Signed)

## 2019-10-17 ENCOUNTER — Encounter (HOSPITAL_BASED_OUTPATIENT_CLINIC_OR_DEPARTMENT_OTHER): Payer: Self-pay | Admitting: Orthopaedic Surgery

## 2019-10-17 ENCOUNTER — Ambulatory Visit (HOSPITAL_BASED_OUTPATIENT_CLINIC_OR_DEPARTMENT_OTHER)
Admission: RE | Admit: 2019-10-17 | Discharge: 2019-10-17 | Disposition: A | Discharge status: Home / Self Care | Payer: BC Managed Care – PPO | Attending: Orthopaedic Surgery | Admitting: Orthopaedic Surgery

## 2019-10-17 ENCOUNTER — Ambulatory Visit (HOSPITAL_BASED_OUTPATIENT_CLINIC_OR_DEPARTMENT_OTHER): Payer: BC Managed Care – PPO | Admitting: Certified Registered"

## 2019-10-17 ENCOUNTER — Encounter (HOSPITAL_BASED_OUTPATIENT_CLINIC_OR_DEPARTMENT_OTHER)
Admission: RE | Disposition: A | Discharge status: Home / Self Care | Payer: Self-pay | Source: Home / Self Care | Attending: Orthopaedic Surgery

## 2019-10-17 ENCOUNTER — Other Ambulatory Visit: Payer: Self-pay

## 2019-10-17 DIAGNOSIS — S83512A Sprain of anterior cruciate ligament of left knee, initial encounter: Secondary | ICD-10-CM | POA: Insufficient documentation

## 2019-10-17 DIAGNOSIS — S83282A Other tear of lateral meniscus, current injury, left knee, initial encounter: Secondary | ICD-10-CM | POA: Diagnosis not present

## 2019-10-17 DIAGNOSIS — G43909 Migraine, unspecified, not intractable, without status migrainosus: Secondary | ICD-10-CM | POA: Diagnosis not present

## 2019-10-17 DIAGNOSIS — Z79899 Other long term (current) drug therapy: Secondary | ICD-10-CM | POA: Insufficient documentation

## 2019-10-17 DIAGNOSIS — M2342 Loose body in knee, left knee: Secondary | ICD-10-CM | POA: Diagnosis not present

## 2019-10-17 DIAGNOSIS — Z87891 Personal history of nicotine dependence: Secondary | ICD-10-CM | POA: Diagnosis not present

## 2019-10-17 DIAGNOSIS — Y9344 Activity, trampolining: Secondary | ICD-10-CM | POA: Diagnosis not present

## 2019-10-17 DIAGNOSIS — S82142A Displaced bicondylar fracture of left tibia, initial encounter for closed fracture: Secondary | ICD-10-CM | POA: Diagnosis not present

## 2019-10-17 HISTORY — DX: Family history of other specified conditions: Z84.89

## 2019-10-17 HISTORY — PX: KNEE ARTHROSCOPY WITH LATERAL MENISECTOMY: SHX6193

## 2019-10-17 HISTORY — PX: KNEE ARTHROSCOPY WITH ANTERIOR CRUCIATE LIGAMENT (ACL) REPAIR: SHX5644

## 2019-10-17 HISTORY — PX: ORIF TIBIA PLATEAU: SHX2132

## 2019-10-17 SURGERY — KNEE ARTHROSCOPY WITH ANTERIOR CRUCIATE LIGAMENT (ACL) REPAIR
Anesthesia: Regional | Site: Knee | Laterality: Left

## 2019-10-17 MED ORDER — FENTANYL CITRATE (PF) 100 MCG/2ML IJ SOLN
INTRAMUSCULAR | Status: AC
  Filled 2019-10-17: qty 2

## 2019-10-17 MED ORDER — DEXMEDETOMIDINE HCL IN NACL 200 MCG/50ML IV SOLN
INTRAVENOUS | Status: AC
  Filled 2019-10-17: qty 50

## 2019-10-17 MED ORDER — OXYCODONE HCL 5 MG/5ML PO SOLN: 5.0000 mg | Freq: Once | ORAL | Status: DC | PRN

## 2019-10-17 MED ORDER — ASPIRIN 81 MG PO CHEW: 81.0000 mg | CHEWABLE_TABLET | Freq: Two times a day (BID) | ORAL | 0 refills | Status: AC

## 2019-10-17 MED ORDER — BUPIVACAINE HCL (PF) 0.5 % IJ SOLN
INTRAMUSCULAR | Status: DC | PRN
  Administered 2019-10-17: 25 mL

## 2019-10-17 MED ORDER — ONDANSETRON HCL 4 MG PO TABS: 4.0000 mg | ORAL_TABLET | Freq: Three times a day (TID) | ORAL | 1 refills | Status: AC | PRN

## 2019-10-17 MED ORDER — DEXAMETHASONE SODIUM PHOSPHATE 10 MG/ML IJ SOLN
INTRAMUSCULAR | Status: DC | PRN
  Administered 2019-10-17: 5 mg via INTRAVENOUS

## 2019-10-17 MED ORDER — LIDOCAINE 2% (20 MG/ML) 5 ML SYRINGE
INTRAMUSCULAR | Status: AC
  Filled 2019-10-17: qty 5

## 2019-10-17 MED ORDER — VANCOMYCIN HCL 1000 MG IV SOLR
INTRAVENOUS | Status: DC | PRN
  Administered 2019-10-17: 1000 mg via TOPICAL

## 2019-10-17 MED ORDER — FENTANYL CITRATE (PF) 100 MCG/2ML IJ SOLN
50.0000 ug | INTRAMUSCULAR | Status: DC | PRN
  Administered 2019-10-17: 100 ug via INTRAVENOUS

## 2019-10-17 MED ORDER — ACETAMINOPHEN 500 MG PO TABS: 1000.0000 mg | ORAL_TABLET | Freq: Three times a day (TID) | ORAL | 0 refills | Status: AC

## 2019-10-17 MED ORDER — VANCOMYCIN HCL 1000 MG IV SOLR
INTRAVENOUS | Status: AC
  Filled 2019-10-17: qty 1000

## 2019-10-17 MED ORDER — FENTANYL CITRATE (PF) 100 MCG/2ML IJ SOLN
INTRAMUSCULAR | Status: DC | PRN
  Administered 2019-10-17 (×2): 25 ug via INTRAVENOUS
  Administered 2019-10-17: 100 ug via INTRAVENOUS

## 2019-10-17 MED ORDER — PROPOFOL 10 MG/ML IV BOLUS
INTRAVENOUS | Status: DC | PRN
  Administered 2019-10-17: 200 mg via INTRAVENOUS

## 2019-10-17 MED ORDER — CEFAZOLIN SODIUM-DEXTROSE 2-4 GM/100ML-% IV SOLN
2.0000 g | INTRAVENOUS | Status: AC
  Administered 2019-10-17: 12:00:00 2 g via INTRAVENOUS

## 2019-10-17 MED ORDER — ONDANSETRON HCL 4 MG/2ML IJ SOLN
INTRAMUSCULAR | Status: DC | PRN
  Administered 2019-10-17: 4 mg via INTRAVENOUS

## 2019-10-17 MED ORDER — OXYCODONE HCL 5 MG PO TABS: 5.0000 mg | ORAL_TABLET | Freq: Once | ORAL | Status: DC | PRN

## 2019-10-17 MED ORDER — LACTATED RINGERS IV SOLN: INTRAVENOUS | Status: DC

## 2019-10-17 MED ORDER — CEFAZOLIN SODIUM-DEXTROSE 2-4 GM/100ML-% IV SOLN
INTRAVENOUS | Status: AC
  Filled 2019-10-17: qty 100

## 2019-10-17 MED ORDER — SODIUM CHLORIDE 0.9 % IR SOLN
Status: DC | PRN
  Administered 2019-10-17: 9000 mL

## 2019-10-17 MED ORDER — HYDROCODONE-ACETAMINOPHEN 5-325 MG PO TABS
ORAL_TABLET | ORAL | Status: AC
  Filled 2019-10-17: qty 1

## 2019-10-17 MED ORDER — LIDOCAINE 2% (20 MG/ML) 5 ML SYRINGE
INTRAMUSCULAR | Status: DC | PRN
  Administered 2019-10-17: 100 mg via INTRAVENOUS

## 2019-10-17 MED ORDER — MELOXICAM 7.5 MG PO TABS: 7.5000 mg | ORAL_TABLET | Freq: Every day | ORAL | 2 refills | Status: AC

## 2019-10-17 MED ORDER — FENTANYL CITRATE (PF) 100 MCG/2ML IJ SOLN
25.0000 ug | INTRAMUSCULAR | Status: DC | PRN
  Administered 2019-10-17 (×2): 50 ug via INTRAVENOUS

## 2019-10-17 MED ORDER — KETOROLAC TROMETHAMINE 30 MG/ML IJ SOLN: 30.0000 mg | Freq: Once | INTRAMUSCULAR | Status: DC | PRN

## 2019-10-17 MED ORDER — OXYCODONE HCL 5 MG PO TABS: ORAL_TABLET | ORAL | 0 refills | Status: AC

## 2019-10-17 MED ORDER — DEXMEDETOMIDINE HCL 200 MCG/2ML IV SOLN
INTRAVENOUS | Status: DC | PRN
  Administered 2019-10-17 (×5): 4 ug via INTRAVENOUS

## 2019-10-17 MED ORDER — MIDAZOLAM HCL 2 MG/2ML IJ SOLN
INTRAMUSCULAR | Status: AC
  Filled 2019-10-17: qty 2

## 2019-10-17 MED ORDER — SODIUM CHLORIDE 0.9 % IR SOLN: Status: DC | PRN

## 2019-10-17 MED ORDER — HYDROCODONE-ACETAMINOPHEN 5-325 MG PO TABS
1.0000 | ORAL_TABLET | Freq: Once | ORAL | Status: AC
  Administered 2019-10-17: 1 via ORAL

## 2019-10-17 MED ORDER — LIDOCAINE-EPINEPHRINE 2 %-1:100000 IJ SOLN
INTRAMUSCULAR | Status: DC | PRN
  Administered 2019-10-17: 10 mL via PERINEURAL

## 2019-10-17 MED ORDER — MIDAZOLAM HCL 2 MG/2ML IJ SOLN
1.0000 mg | INTRAMUSCULAR | Status: DC | PRN
  Administered 2019-10-17: 2 mg via INTRAVENOUS

## 2019-10-17 SURGICAL SUPPLY — 83 items
BIT DRILL LNG QR ACUTRAK 2 (BIT) ×3 IMPLANT
BIT DRILL MICR ACTRK 2 LNG PRF (BIT) ×1 IMPLANT
BLADE AVERAGE 25MMX9MM (BLADE)
BLADE AVERAGE 25X9 (BLADE) IMPLANT
BLADE HEX COATED 2.75 (ELECTRODE) ×3 IMPLANT
BLADE SHAVER BONE 5.0MM X 13CM (MISCELLANEOUS) ×1
BLADE SHAVER BONE 5.0X13 (MISCELLANEOUS) ×2 IMPLANT
BLADE SURG 10 STRL SS (BLADE) ×3 IMPLANT
BLADE SURG 15 STRL LF DISP TIS (BLADE) ×1 IMPLANT
BLADE SURG 15 STRL SS (BLADE) ×2
BNDG ELASTIC 6X5.8 VLCR STR LF (GAUZE/BANDAGES/DRESSINGS) IMPLANT
BONE TUNNEL PLUG CANNULATED (MISCELLANEOUS) ×3 IMPLANT
BURR OVAL 8 FLU 4.0MM X 13CM (MISCELLANEOUS)
BURR OVAL 8 FLU 4.0X13 (MISCELLANEOUS) IMPLANT
CHLORAPREP W/TINT 26 (MISCELLANEOUS) ×3 IMPLANT
CLOSURE STERI-STRIP 1/2X4 (GAUZE/BANDAGES/DRESSINGS) ×2
CLSR STERI-STRIP ANTIMIC 1/2X4 (GAUZE/BANDAGES/DRESSINGS) ×4 IMPLANT
COVER BACK TABLE 60X90IN (DRAPES) ×6 IMPLANT
COVER WAND RF STERILE (DRAPES) IMPLANT
CUFF TOURN SGL QUICK 34 (TOURNIQUET CUFF) ×3
CUFF TRNQT CYL 34X4.125X (TOURNIQUET CUFF) ×1 IMPLANT
DECANTER SPIKE VIAL GLASS SM (MISCELLANEOUS) IMPLANT
DISSECTOR 3.5MM X 13CM CVD (MISCELLANEOUS) IMPLANT
DISSECTOR 4.0MMX13CM CVD (MISCELLANEOUS) ×3 IMPLANT
DRAPE ARTHROSCOPY W/POUCH 90 (DRAPES) ×3 IMPLANT
DRAPE IMP U-DRAPE 54X76 (DRAPES) ×3 IMPLANT
DRAPE TOP ARMCOVERS (MISCELLANEOUS) ×3 IMPLANT
DRAPE U-SHAPE 47X51 STRL (DRAPES) ×3 IMPLANT
DRILL MICRO ACUTRAK 2 LNG PROF (BIT) ×3
ELECT REM PT RETURN 9FT ADLT (ELECTROSURGICAL) ×3
ELECTRODE REM PT RTRN 9FT ADLT (ELECTROSURGICAL) ×1 IMPLANT
GAUZE SPONGE 4X4 12PLY STRL (GAUZE/BANDAGES/DRESSINGS) ×6 IMPLANT
GLOVE BIO SURGEON STRL SZ 6.5 (GLOVE) ×2 IMPLANT
GLOVE BIO SURGEONS STRL SZ 6.5 (GLOVE) ×1
GLOVE BIOGEL PI IND STRL 6.5 (GLOVE) ×1 IMPLANT
GLOVE BIOGEL PI IND STRL 7.0 (GLOVE) ×2 IMPLANT
GLOVE BIOGEL PI IND STRL 8 (GLOVE) ×1 IMPLANT
GLOVE BIOGEL PI INDICATOR 6.5 (GLOVE) ×2
GLOVE BIOGEL PI INDICATOR 7.0 (GLOVE) ×4
GLOVE BIOGEL PI INDICATOR 8 (GLOVE) ×2
GLOVE ECLIPSE 6.5 STRL STRAW (GLOVE) ×3 IMPLANT
GLOVE ECLIPSE 8.0 STRL XLNG CF (GLOVE) ×3 IMPLANT
GOWN STRL REUS W/ TWL LRG LVL3 (GOWN DISPOSABLE) ×2 IMPLANT
GOWN STRL REUS W/TWL LRG LVL3 (GOWN DISPOSABLE) ×4
GOWN STRL REUS W/TWL XL LVL3 (GOWN DISPOSABLE) ×3 IMPLANT
GUIDEPIN FLEX PATHFINDER 2.4MM (WIRE) IMPLANT
GUIDEWIRE ORTHO MINI ACTK .045 (WIRE) ×3 IMPLANT
IMMOBILIZER KNEE 22 UNIV (SOFTGOODS) IMPLANT
IMMOBILIZER KNEE 24 THIGH 36 (MISCELLANEOUS) IMPLANT
IMMOBILIZER KNEE 24 UNIV (MISCELLANEOUS)
IV NS IRRIG 3000ML ARTHROMATIC (IV SOLUTION) ×21 IMPLANT
K-WIRE .045X4 (WIRE) ×3 IMPLANT
KIT TRANSTIBIAL (DISPOSABLE) ×3 IMPLANT
KNEE WRAP E Z 3 GEL PACK (MISCELLANEOUS) ×3 IMPLANT
KNIFE GRAFT ACL 10MM 5952 (MISCELLANEOUS) IMPLANT
KNIFE GRAFT ACL 9MM (MISCELLANEOUS) IMPLANT
MANIFOLD NEPTUNE II (INSTRUMENTS) ×3 IMPLANT
NDL SAFETY ECLIPSE 18X1.5 (NEEDLE) ×1 IMPLANT
NEEDLE HYPO 18GX1.5 SHARP (NEEDLE) ×2
NS IRRIG 1000ML POUR BTL (IV SOLUTION) ×3 IMPLANT
PACK ARTHROSCOPY DSU (CUSTOM PROCEDURE TRAY) ×3 IMPLANT
PENCIL SMOKE EVACUATOR (MISCELLANEOUS) IMPLANT
PORT APPOLLO RF 90DEGREE MULTI (SURGICAL WAND) ×3 IMPLANT
SCREW ACUTRAK 2 MICRO 18MM (Screw) ×3 IMPLANT
SCREW ACUTRAK 2 STD 28MM (Screw) ×3 IMPLANT
SCREW ACUTRAK 2 STD 30MM (Screw) ×3 IMPLANT
SET BASIN DAY SURGERY F.S. (CUSTOM PROCEDURE TRAY) ×3 IMPLANT
SLEEVE SCD COMPRESS KNEE MED (MISCELLANEOUS) ×3 IMPLANT
SPONGE LAP 4X18 RFD (DISPOSABLE) ×3 IMPLANT
SUT ETHIBOND 5 30IN (SUTURE) IMPLANT
SUT FIBERWIRE #2 38 T-5 BLUE (SUTURE)
SUT MNCRL AB 4-0 PS2 18 (SUTURE) ×3 IMPLANT
SUT VIC AB 0 CT1 27 (SUTURE) ×9
SUT VIC AB 0 CT1 27XBRD ANBCTR (SUTURE) ×3 IMPLANT
SUT VIC AB 3-0 SH 27 (SUTURE) ×2
SUT VIC AB 3-0 SH 27X BRD (SUTURE) ×1 IMPLANT
SUTURE FIBERWR #2 38 T-5 BLUE (SUTURE) IMPLANT
SYR 5ML LL (SYRINGE) ×3 IMPLANT
TOWEL GREEN STERILE FF (TOWEL DISPOSABLE) ×6 IMPLANT
TUBE CONNECTING 20'X1/4 (TUBING) ×1
TUBE CONNECTING 20X1/4 (TUBING) ×2 IMPLANT
TUBE SUCTION HIGH CAP CLEAR NV (SUCTIONS) ×3 IMPLANT
TUBING ARTHROSCOPY IRRIG 16FT (MISCELLANEOUS) ×3 IMPLANT

## 2019-10-17 NOTE — Anesthesia Postprocedure Evaluation (Signed)
Anesthesia Post Note  Patient: Kyle Hardy  Procedure(s) Performed: LEFT KNEE ARTHROSCOPY WITH ANTERIOR CRUCIATE LIGAMENT (ACL) REPAIR AND TIBIAL PLATEAU BICONDYLAR INTERAL FIXATION (Left Knee) KNEE ARTHROSCOPY WITH LATERAL MENISCUS ROOT REPAIR (Left Knee) OPEN REDUCTION INTERNAL FIXATION (ORIF) TIBIAL PLATEAU (Left Knee)     Patient location during evaluation: PACU Anesthesia Type: General and Regional Level of consciousness: awake and alert Pain management: pain level controlled Vital Signs Assessment: post-procedure vital signs reviewed and stable Respiratory status: spontaneous breathing, nonlabored ventilation, respiratory function stable and patient connected to nasal cannula oxygen Cardiovascular status: blood pressure returned to baseline and stable Postop Assessment: no apparent nausea or vomiting Anesthetic complications: no    Last Vitals:  Vitals:   10/17/19 1527 10/17/19 1542  BP:    Pulse: 92 89  Resp: 19 16  Temp:    SpO2: 100% 100%    Last Pain:  Vitals:   10/17/19 1542  TempSrc:   PainSc: 4                  Adaleen Hulgan L Karla Vines

## 2019-10-17 NOTE — Transfer of Care (Signed)
Immediate Anesthesia Transfer of Care Note  Patient: Kyle Hardy  Procedure(s) Performed: LEFT KNEE ARTHROSCOPY WITH ANTERIOR CRUCIATE LIGAMENT (ACL) REPAIR AND TIBIAL PLATEAU BICONDYLAR INTERAL FIXATION (Left Knee) KNEE ARTHROSCOPY WITH LATERAL MENISCUS ROOT REPAIR (Left Knee) OPEN REDUCTION INTERNAL FIXATION (ORIF) TIBIAL PLATEAU (Left Knee)  Patient Location: PACU  Anesthesia Type:General and Regional  Level of Consciousness: drowsy  Airway & Oxygen Therapy: Patient Spontanous Breathing and Patient connected to face mask oxygen  Post-op Assessment: Report given to RN and Post -op Vital signs reviewed and stable  Post vital signs: Reviewed and stable  Last Vitals:  Vitals Value Taken Time  BP 102/54 10/17/19 1435  Temp    Pulse 83 10/17/19 1438  Resp 14 10/17/19 1438  SpO2 100 % 10/17/19 1438  Vitals shown include unvalidated device data.  Last Pain:  Vitals:   10/17/19 1000  TempSrc: Oral  PainSc: 4       Patients Stated Pain Goal: 4 (10/17/19 1000)  Complications: No apparent anesthesia complications

## 2019-10-17 NOTE — Anesthesia Procedure Notes (Signed)
Anesthesia Procedure Image    

## 2019-10-17 NOTE — Interval H&P Note (Signed)
History and Physical Interval Note:  10/17/2019 10:03 AM  Kyle Hardy  has presented today for surgery, with the diagnosis of LEFT ACL TEAR. LATERAL MENISCUS TEAR, TIBIAL PLATEAU FRACTURE.  The various methods of treatment have been discussed with the patient and family. After consideration of risks, benefits and other options for treatment, the patient has consented to  Procedure(s): LEFT KNEE ARTHROSCOPY WITH ANTERIOR CRUCIATE LIGAMENT (ACL) REPAIR AND TIBIAL PLATEAU BICONDYLAR INTERAL FIXATION (Left) KNEE ARTHROSCOPY WITH LATERAL MENISCUS REPAIR (Left) as a surgical intervention.  The patient's history has been reviewed, patient examined, no change in status, stable for surgery.  I have reviewed the patient's chart and labs.  Questions were answered to the patient's satisfaction.     Bjorn Pippin

## 2019-10-17 NOTE — Discharge Instructions (Signed)
Work Note:  Kyle Hardy [birth year] Today's Date: 10/17/2019  Patient to be out of work until next follow-up appointment.   Alfonse Alpers, PA-C                      Hydrocodone-acetaminophen (Norco/Vicodin) given at 4:15pm. No Tylenol or Oxycodone until 10:15pm if needed.   Post Anesthesia Home Care Instructions  Activity: Get plenty of rest for the remainder of the day. A responsible individual must stay with you for 24 hours following the procedure.  For the next 24 hours, DO NOT: -Drive a car -Advertising copywriter -Drink alcoholic beverages -Take any medication unless instructed by your physician -Make any legal decisions or sign important papers.  Meals: Start with liquid foods such as gelatin or soup. Progress to regular foods as tolerated. Avoid greasy, spicy, heavy foods. If nausea and/or vomiting occur, drink only clear liquids until the nausea and/or vomiting subsides. Call your physician if vomiting continues.  Special Instructions/Symptoms: Your throat may feel dry or sore from the anesthesia or the breathing tube placed in your throat during surgery. If this causes discomfort, gargle with warm salt water. The discomfort should disappear within 24 hours.  If you had a scopolamine patch placed behind your ear for the management of post- operative nausea and/or vomiting:  1. The medication in the patch is effective for 72 hours, after which it should be removed.  Wrap patch in a tissue and discard in the trash. Wash hands thoroughly with soap and water. 2. You may remove the patch earlier than 72 hours if you experience unpleasant side effects which may include dry mouth, dizziness or visual disturbances. 3. Avoid touching the patch. Wash your hands with soap and water after contact with the patch.    Regional Anesthesia Blocks  1. Numbness or the inability to move the "blocked" extremity may last from 3-48 hours after placement. The length of time  depends on the medication injected and your individual response to the medication. If the numbness is not going away after 48 hours, call your surgeon.  2. The extremity that is blocked will need to be protected until the numbness is gone and the  Strength has returned. Because you cannot feel it, you will need to take extra care to avoid injury. Because it may be weak, you may have difficulty moving it or using it. You may not know what position it is in without looking at it while the block is in effect.  3. For blocks in the legs and feet, returning to weight bearing and walking needs to be done carefully. You will need to wait until the numbness is entirely gone and the strength has returned. You should be able to move your leg and foot normally before you try and bear weight or walk. You will need someone to be with you when you first try to ensure you do not fall and possibly risk injury.  4. Bruising and tenderness at the needle site are common side effects and will resolve in a few days.  5. Persistent numbness or new problems with movement should be communicated to the surgeon or the [facility] [phone] [facility] [phone]).Regional Anesthesia Blocks  1. Numbness or the inability to move the "blocked" extremity may last from 3-48 hours after placement. The length of time depends on the medication injected and your individual response to the medication. If the numbness is not going away after 48 hours, call  your surgeon.  2. The extremity that is blocked will need to be protected until the numbness is gone and the  Strength has returned. Because you cannot feel it, you will need to take extra care to avoid injury. Because it may be weak, you may have difficulty moving it or using it. You may not know what position it is in without looking at it while the block is in effect.  3. For blocks in the legs and feet, returning to weight bearing and walking needs  to be done carefully. You will need to wait until the numbness is entirely gone and the strength has returned. You should be able to move your leg and foot normally before you try and bear weight or walk. You will need someone to be with you when you first try to ensure you do not fall and possibly risk injury.  4. Bruising and tenderness at the needle site are common side effects and will resolve in a few days.  5. Persistent numbness or new problems with movement should be communicated to the surgeon or the [place] [phone] [place] [phone]).

## 2019-10-17 NOTE — Progress Notes (Signed)
Assisted Dr. Rose with left, ultrasound guided, femoral block. Side rails up, monitors on throughout procedure. See vital signs in flow sheet. Tolerated Procedure well. 

## 2019-10-17 NOTE — Anesthesia Preprocedure Evaluation (Signed)
Anesthesia Evaluation  Patient identified by MRN, date of birth, ID band Patient awake    Reviewed: Allergy & Precautions, NPO status , Patient's Chart, lab work & pertinent test results  Airway Mallampati: II  TM Distance: >3 FB Neck ROM: Full    Dental no notable dental hx.    Pulmonary neg pulmonary ROS, former smoker,    Pulmonary exam normal breath sounds clear to auscultation       Cardiovascular negative cardio ROS Normal cardiovascular exam Rhythm:Regular Rate:Normal     Neuro/Psych negative neurological ROS  negative psych ROS   GI/Hepatic negative GI ROS, Neg liver ROS,   Endo/Other  negative endocrine ROS  Renal/GU negative Renal ROS  negative genitourinary   Musculoskeletal negative musculoskeletal ROS (+)   Abdominal   Peds negative pediatric ROS (+)  Hematology negative hematology ROS (+)   Anesthesia Other Findings   Reproductive/Obstetrics negative OB ROS                             Anesthesia Physical Anesthesia Plan  ASA: I  Anesthesia Plan: General   Post-op Pain Management:  Regional for Post-op pain   Induction: Intravenous  PONV Risk Score and Plan: 2 and Ondansetron, Dexamethasone and Treatment may vary due to age or medical condition  Airway Management Planned: LMA  Additional Equipment:   Intra-op Plan:   Post-operative Plan: Extubation in OR  Informed Consent: I have reviewed the patients History and Physical, chart, labs and discussed the procedure including the risks, benefits and alternatives for the proposed anesthesia with the patient or authorized representative who has indicated his/her understanding and acceptance.     Dental advisory given  Plan Discussed with: CRNA and Surgeon  Anesthesia Plan Comments:         Anesthesia Quick Evaluation

## 2019-10-17 NOTE — Anesthesia Procedure Notes (Signed)
Procedure Name: LMA Insertion Date/Time: 10/17/2019 12:06 PM Performed by: Marny Lowenstein, CRNA Pre-anesthesia Checklist: Patient identified, Emergency Drugs available, Suction available and Patient being monitored Patient Re-evaluated:Patient Re-evaluated prior to induction Oxygen Delivery Method: Circle system utilized Preoxygenation: Pre-oxygenation with 100% oxygen Induction Type: IV induction Ventilation: Mask ventilation without difficulty LMA: LMA inserted LMA Size: 5.0 Number of attempts: 1 Placement Confirmation: positive ETCO2 and breath sounds checked- equal and bilateral Tube secured with: Tape Dental Injury: Teeth and Oropharynx as per pre-operative assessment

## 2019-10-17 NOTE — Interval H&P Note (Signed)
History and Physical Interval Note:  10/17/2019 10:05 AM  Kyle Hardy  has presented today for surgery, with the diagnosis of LEFT ACL TEAR. LATERAL MENISCUS TEAR, TIBIAL PLATEAU FRACTURE.  The various methods of treatment have been discussed with the patient and family. After consideration of risks, benefits and other options for treatment, the patient has consented to  Procedure(s): LEFT KNEE ARTHROSCOPY WITH ANTERIOR CRUCIATE LIGAMENT (ACL) REPAIR AND TIBIAL PLATEAU BICONDYLAR INTERAL FIXATION (Left) KNEE ARTHROSCOPY WITH LATERAL MENISCUS REPAIR (Left) as a surgical intervention.  The patient's history has been reviewed, patient examined, no change in status, stable for surgery.  I have reviewed the patient's chart and labs.  Questions were answered to the patient's satisfaction.     Bjorn Pippin

## 2019-10-17 NOTE — Anesthesia Procedure Notes (Signed)
Anesthesia Regional Block: Femoral nerve block   Pre-Anesthetic Checklist: ,, timeout performed, Correct Patient, Correct Site, Correct Laterality, Correct Procedure, Correct Position, site marked, Risks and benefits discussed,  Surgical consent,  Pre-op evaluation,  At surgeon's request and post-op pain management  Laterality: Left  Prep: chloraprep       Needles:  Injection technique: Single-shot  Needle Type: Echogenic Needle     Needle Length: 9cm      Additional Needles:   Procedures:,,,, ultrasound used (permanent image in chart),,,,  Narrative:  Start time: 10/17/2019 11:20 AM End time: 10/17/2019 11:28 AM Injection made incrementally with aspirations every 5 mL.  Performed by: Personally  Anesthesiologist: Eilene Ghazi, MD  Additional Notes: Patient tolerated the procedure well without complications

## 2019-10-17 NOTE — Op Note (Signed)
Orthopaedic Surgery Operative Note (CSN: [id number])  Kyle Hardy  02-22-1996 Date of Surgery: 10/17/2019   Diagnoses:  LEFT ACL TEAR. LATERAL MENISCUS TEAR, TIBIAL PLATEAU FRACTURE bicondylar with loose bodies  Procedure: Arthroscopic ACL repair of tibial insertion Arthroscopic bicondylar tibial plateau open reduction internal fixation Arthroscopic lateral meniscal root anterior repair Arthroscopic loose body excision Open lateral plateau ORIF    Operative Finding Exam under anesthesia: Clear instability with Lachman, varus valgus normal Suprapatellar pouch: Multiple osteochondral loose bodies x3 Patellofemoral Compartment: Normal Medial Compartment: Extensive cartilage loss involving about 30% of the central portion of the medial weightbearing plateau that the meniscus was normal in the outer periphery of the medial compartment was normal.  There is significant fragmentation of the medial plateau and so this was fixed arthroscopically all other was repaired open with standard AccuTrack screws.  We did have to make a medial open arthrotomy to fix the far medial plateau as this was not amenable to arthroscopic repair. Lateral Compartment: Lateral plateau fragment is anchored with the lateral meniscus to a fragment near the ACL.  We are able to repair this through bone tunnels. Intercondylar Notch: ACL had some mild edema but clearly avulsed the tibial insertion of the spine.  Successful completion of the planned procedure.  This is extraordinarily difficult case and unfortunately the patient's medial plateau injury was severe.  He is at high likelihood of going on subsidence and having postoperative arthrosis due to his injury on the medial plateau.  May be a candidate for tibial allograft if needed.  This would be very difficult to access however.  He had good fixation with standard AccuTrack screws however these fragments were very small.  He still had diastases that were associated  with his comminution were overall happy with his general stability at the end of the case and his articular reduction.  May be a candidate for a ACL reconstruction as well but would have to have equipment available to remove AccuTrack screws.  Post-operative plan: The patient will be nonweightbearing for 6 weeks with range of motion to start after week 1.  The patient will be discharged home.  DVT prophylaxis Aspirin 81 mg twice daily for 6 weeks.  Pain control with PRN pain medication preferring oral medicines.  Follow up plan will be scheduled in approximately 7 days for incision check and XR.  Post-Op Diagnosis: Same Surgeons:Primary: Hiram Gash, MD Assistants:Caroline McBane PA-C Location: [place] OR ROOM 6 Anesthesia: General with femoral Antibiotics: Ancef 2 g with local vancomycin powder 1 g at the surgical site Tourniquet time:  Total Tourniquet Time Documented: Thigh (Right) - 124 minutes Total: Thigh (Right) - 124 minutes  Estimated Blood Loss: Minimal Complications: None Specimens: None Implants: Implant Name Type Inv. Item Serial No. Manufacturer Lot No. LRB No. Used Action  SCREW ACUTRAK 2 STD 28MM - [id number] Screw SCREW ACUTRAK 2 STD 28MM  [employer]  Left 1 Implanted  SCREW ACUTRAK 2 STD 30MM - [id number] Screw SCREW ACUTRAK 2 STD 30MM  [employer]  Left 1 Implanted    Indications for Surgery:   Kyle Hardy is a 24 y.o. male with trampoline related injury resulting in a comminuted tibial spine avulsion with involvement of the plateau.  Benefits and risks of operative and nonoperative management were discussed prior to surgery with patient/guardian(s) and informed consent form was completed.  Specific risks including infection, need for additional surgery, nonunion, postoperative arthrosis, loss of stability or fixation need for further surgeries  Procedure:   The patient was identified properly. Informed consent was obtained and the surgical site was marked. The  patient was taken up to suite where general anesthesia was induced. The patient was placed in the supine position with a post against the surgical leg and a nonsterile tourniquet applied. The surgical leg was then prepped and draped usual sterile fashion.  A standard surgical timeout was performed.  2 standard anterior portals were made and diagnostic arthroscopy performed. Please note the findings as noted above.  Began by assessing the fragments up in front of the knee.  Extensive fat pad resection was performed with lysis of adhesions.  Once were able to visualize the fragments we then used a series of percutaneously placed portals as well as blunt instruments including improvement a Therapist, nutritional to gently reduce the fragments.  We noted that the medial plateau was fragmented and was released 3 fragments.  There were 3 loose bodies in the front of the knee as well as cerebral intact was removed with a combination of graspers and shaver.  At this point we took stock remaining fragments.  Essentially there was a fragment posterior to the ACL that was nonarticular and we did not feel would necessitate repair.  The ACL was attached to a fragment that was additionally attached to the lateral meniscal anterior horn.  There is a intercalary fragment anterior medial to the ACL that extends underneath the anterior horn of the meniscus.  There was then a large fragment with loss bone in the articular surface due to comminution in the midpoint of the plateau near the central portion of the distribution of weight of the medial joint.  Once we taken stock in all this we attempted arthroscopically reduced the medial plateau intercalary fragment however it was not able to be performed.  We will with remove this loose piece en bloc after attempting to fix it with a screw however it was not amenable to this.  We then were able to get freedom to reduce our ACL fragment.  We reduced this holding in place with blunt  instruments.  Then used an ACL tibial guide set at 55 degrees and were able to pass to bone tunnels just medial and lateral to the ACL itself through our ACL fragment avoiding the meniscal root.  There was a shuttle a fiber tape using a suture lasso and passed through to bone tunnels.  Were able to hold this reduced well placed in the knee in slight posterior drawer well tying or not to produce good tension on the ACL repair.  Lachman demonstrated minimal translation.  This was an arthroscopic repair of the medial meniscal root, lateral plateau and part of the medial plateau.  We again assessed articular surface are very concerned about the articular surface of the medial side of the knee.  This young patient with a large body habitus complex distress in this area.  We felt that open reduction internal fixation attempt would be worthwhile for the patient even in the setting of increased risk of arthrofibrosis.  These fragments are not amenable to suture repair.  We extended our medial incision from the medial aspect of the tibia anterior to the midpoint of the patella.  With the skin sharp achieving hemostasis as.  We are able to make a medial parapatellar approach and identify the medial portion of the joint.  We then were able to clear hematoma in place our bony fragments back into place achieving a near anatomic reduction of  the joint surface.  There are some areas of diastases but overall things were relatively well aligned.  We placed cannulated wires and then this to standard AccuTrack screws one in each fragment.  There began after second screw.  We were able to palpate and visually checked our reduction were happy with the stability of the fracture as well as the final reduction.  Incisions closed with absorbable suture.  After sterile dressing was placed placed patient's brace.  The patient was awoken from general anesthesia and taken to the PACU in stable condition without complication.    Alfonse Alpers, PA-C, present and scrubbed throughout the case, critical for completion in a timely fashion, and for retraction, instrumentation, closure.

## 2019-10-22 ENCOUNTER — Encounter: Payer: Self-pay | Admitting: *Deleted

## 2021-11-29 ENCOUNTER — Emergency Department (HOSPITAL_COMMUNITY)
Admission: EM | Admit: 2021-11-29 | Discharge: 2021-11-29 | Disposition: A | Discharge status: Home / Self Care | Payer: Self-pay | Attending: Emergency Medicine | Admitting: Emergency Medicine

## 2021-11-29 ENCOUNTER — Emergency Department (HOSPITAL_COMMUNITY): Payer: Self-pay

## 2021-11-29 ENCOUNTER — Encounter (HOSPITAL_COMMUNITY): Payer: Self-pay | Admitting: Emergency Medicine

## 2021-11-29 ENCOUNTER — Other Ambulatory Visit: Payer: Self-pay

## 2021-11-29 DIAGNOSIS — W228XXA Striking against or struck by other objects, initial encounter: Secondary | ICD-10-CM | POA: Insufficient documentation

## 2021-11-29 DIAGNOSIS — S92315A Nondisplaced fracture of first metatarsal bone, left foot, initial encounter for closed fracture: Secondary | ICD-10-CM | POA: Insufficient documentation

## 2021-11-29 DIAGNOSIS — S92425A Nondisplaced fracture of distal phalanx of left great toe, initial encounter for closed fracture: Secondary | ICD-10-CM

## 2021-11-29 NOTE — Discharge Instructions (Addendum)
Follow up with Orthopaedist if pain persist  °

## 2021-11-29 NOTE — ED Provider Notes (Addendum)
[facility] EMERGENCY DEPARTMENT Provider Note   CSN: [id number] Arrival date & time: 11/29/21  1738     History  Chief Complaint  Patient presents with   Toe Pain    Kyle Hardy is a 26 y.o. male.  Pt reports he hit his toe last pm.  Pt complains of swelling and pain.    The history is provided by the patient. No language interpreter was used.  Toe Pain This is a new problem. The current episode started yesterday. The problem occurs constantly. The problem has not changed since onset.Nothing aggravates the symptoms. Nothing relieves the symptoms.       Home Medications Prior to Admission medications   Medication Sig Start Date End Date Taking? Authorizing Provider  propranolol (INDERAL) 20 MG tablet Take 20 mg by mouth daily.     [provider]  SUMAtriptan (IMITREX) 50 MG tablet Take 50 mg by mouth every 2 (two) hours as needed for migraine. May repeat in 2 hours if headache persists or recurs.    [provider]  traZODone (DESYREL) 50 MG tablet Take 50-100 mg by mouth at bedtime.    [provider]      Allergies    Patient has no known allergies.    Review of Systems   Review of Systems  All other systems reviewed and are negative.  Physical Exam Updated Vital Signs BP (!) 161/105 (BP Location: Right Arm)   Pulse (!) 113   Temp 98.5 F (36.9 C) (Oral)   Resp 18   SpO2 100%  Physical Exam Vitals and nursing note reviewed.  Constitutional:      Appearance: He is well-developed.  HENT:     Head: Normocephalic.  Abdominal:     General: There is no distension.  Musculoskeletal:        General: Swelling and tenderness present. Normal range of motion.     Cervical back: Normal range of motion.     Comments: Swollen tender left 1st toe and distal foot  nv and ns intact   Neurological:     General: No focal deficit present.     Mental Status: He is alert and oriented to person, place, and time.    ED  Results / Procedures / Treatments   Labs (all labs ordered are listed, but only abnormal results are displayed) Labs Reviewed - No data to display  EKG None  Radiology DG Foot Complete Left  Result Date: 11/29/2021 CLINICAL DATA:  Accidentally kicked a chair with left big toe. EXAM: LEFT FOOT - COMPLETE 3+ VIEW COMPARISON:  Radiograph 11/23/2017 FINDINGS: Suspected nondisplaced fracture of the great toe distal phalanx medial aspect extending to the articular surface. This is best appreciated on the lateral view. No other fracture of the foot. Suggestion of pes planus on these nonweightbearing views. Otherwise normal alignment. There is mild soft tissue edema of the forefoot. IMPRESSION: Suspected nondisplaced great toe distal phalanx fracture extending to the articular surface. Electronically Signed   By: Narda Rutherford M.D.   On: 11/29/2021 19:12    Procedures Procedures    Medications Ordered in ED Medications - No data to display  ED Course/ Medical Decision Making/ A&P                           Medical Decision Making Pt complains of pain in his toe  Amount and/or Complexity of Data Reviewed External Data Reviewed: notes.  Details: Pt has seen Dr Everardo Pacific in the past.  Notes reviewed Radiology: ordered and independent interpretation performed. Decision-making details documented in ED Course.    Details: Xray shows a nondisplaced fracture of left great toe distal phalanx  Risk OTC drugs. Risk Details: Pt has a fracture of distal phalanx.  Pt counseled on fracture management        Xray shows fx 1st toe,  Pt placed in a buddy tape and given post op shoe    Final Clinical Impression(s) / ED Diagnoses Final diagnoses:  Closed nondisplaced fracture of first metatarsal bone of left foot, initial encounter    Rx / DC Orders ED Discharge Orders     None     An After Visit Summary was printed and given to the patient.     Elson Areas, PA-C 11/29/21  1944    Elson Areas, PA-C 11/29/21 2014    Tegeler, Canary Brim, MD 11/29/21 2146    Elson Areas, PA-C 01/14/22 1509    Tegeler, Canary Brim, MD 01/14/22 [phone]

## 2021-11-29 NOTE — ED Triage Notes (Signed)
Patient here with complaint of toe pain on left foot after stubbing his toe. States he went to bed and woke up and the pain was worse and throbbing.

## 2022-07-22 ENCOUNTER — Emergency Department (HOSPITAL_COMMUNITY)
Admission: EM | Admit: 2022-07-22 | Discharge: 2022-07-23 | Disposition: A | Discharge status: Home / Self Care | Payer: Medicaid Other | Attending: Emergency Medicine | Admitting: Emergency Medicine

## 2022-07-22 DIAGNOSIS — J069 Acute upper respiratory infection, unspecified: Secondary | ICD-10-CM

## 2022-07-22 DIAGNOSIS — R21 Rash and other nonspecific skin eruption: Secondary | ICD-10-CM

## 2022-07-22 LAB — GROUP A STREP BY PCR: Group A Strep by PCR: NOT DETECTED

## 2022-07-22 MED ORDER — PREDNISONE 20 MG PO TABS
40.0000 mg | ORAL_TABLET | Freq: Once | ORAL | Status: AC
  Administered 2022-07-22: 40 mg via ORAL
  Filled 2022-07-22: qty 2

## 2022-07-22 NOTE — ED Triage Notes (Signed)
Patient here for evaluation of an itching rash that started on his left arm four days ago and has spread everywhere except his genitals and feet.

## 2022-07-22 NOTE — ED Notes (Addendum)
Pt came out of room and stated that he had to leave because his ride had to go. Pt encouraged to stay but he states that he has to go. Pt ambulated without assistance and is alert and oriented x 4. Will make provider aware

## 2022-07-22 NOTE — ED Provider Notes (Signed)
  [facility] Provider Note   CSN: [id number] Arrival date & time: 07/22/22  1501     History {Add pertinent medical, surgical, social history, OB history to HPI:1} Chief Complaint  Patient presents with   Rash    Kyle Hardy is a 27 y.o. male.  HPI     Home Medications Prior to Admission medications   Medication Sig Start Date End Date Taking? Authorizing Provider  propranolol (INDERAL) 20 MG tablet Take 20 mg by mouth daily.     [provider]  SUMAtriptan (IMITREX) 50 MG tablet Take 50 mg by mouth every 2 (two) hours as needed for migraine. May repeat in 2 hours if headache persists or recurs.    [provider]  traZODone (DESYREL) 50 MG tablet Take 50-100 mg by mouth at bedtime.    [provider]      Allergies    Patient has no known allergies.    Review of Systems   Review of Systems  Physical Exam Updated Vital Signs BP (!) 140/99   Pulse 88   Temp 98.1 F (36.7 C) (Oral)   Resp 17   SpO2 100%  Physical Exam  ED Results / Procedures / Treatments   Labs (all labs ordered are listed, but only abnormal results are displayed) Labs Reviewed  GROUP A STREP BY PCR    EKG None  Radiology No results found.  Procedures Procedures  {Document cardiac monitor, telemetry assessment procedure when appropriate:1}  Medications Ordered in ED Medications  predniSONE (DELTASONE) tablet 40 mg (40 mg Oral Given 07/22/22 2148)    ED Course/ Medical Decision Making/ A&P   {   Click here for ABCD2, HEART and other calculatorsREFRESH Note before signing :1}                          Medical Decision Making  ***  {Document critical care time when appropriate:1} {Document review of labs and clinical decision tools ie heart score, Chads2Vasc2 etc:1}  {Document your independent review of radiology images, and any outside records:1} {Document your discussion with family members,  caretakers, and with consultants:1} {Document social determinants of health affecting pt's care:1} {Document your decision making why or why not admission, treatments were needed:1} Final Clinical Impression(s) / ED Diagnoses Final diagnoses:  None    Rx / DC Orders ED Discharge Orders     None

## 2022-07-22 NOTE — ED Notes (Signed)
Pt came back in stating that he wishes to stay and finish visit. Disposition updated and provider aware pt back in room

## 2022-07-22 NOTE — ED Provider Triage Note (Signed)
Emergency Medicine Provider Triage Evaluation Note  Kyle Hardy , a 27 y.o. male  was evaluated in triage.  Pt complains of sore throat, rash.  Patient reports sore throat beginning on Sunday of this past week.  Reports symptoms present for 1 to 2 days before rash appeared.  Rash appeared on left upper extremity but now is diffusely spread all over her body.  Has been using at home Benadryl as well as cortisone cream of which has helped with the itch but not with decrease of the rash.  Denies fever, chills, current sore throat..  Review of Systems  Positive: See above Negative:   Physical Exam  BP (!) 161/98   Pulse 97   Temp 99 F (37.2 C) (Oral)   Resp 20   SpO2 100%  Gen:   Awake, no distress   Resp:  Normal effort  MSK:   Moves extremities without difficulty  Other:    Medical Decision Making  Medically screening exam initiated at 4:29 PM.  Appropriate orders placed.  Kyle Hardy was informed that the remainder of the evaluation will be completed by another provider, this initial triage assessment does not replace that evaluation, and the importance of remaining in the ED until their evaluation is complete.    Kyle Hardy, [place] 07/22/22 1630

## 2022-07-23 ENCOUNTER — Telehealth: Payer: Self-pay

## 2022-07-23 MED ORDER — PREDNISONE 20 MG PO TABS: 40.0000 mg | ORAL_TABLET | Freq: Every day | ORAL | 0 refills | Status: AC

## 2022-07-23 NOTE — Telephone Encounter (Signed)
Patient called in to ask where prescription was. It was printed with packet. He found it and will take it to a pharmacy

## 2022-07-23 NOTE — Discharge Instructions (Signed)
You were seen for your rash in the emergency department.   At home, please take the prednisone we have prescribed you by mouth for your rash.  You may also use Benadryl for any itching.  Please keep an eye on the detergents you are using as well as any new lotions.    Check your MyChart online for the results of any tests that had not resulted by the time you left the emergency department.   Follow-up with your primary doctor in 2-3 days regarding your visit.  Follow-up with dermatology as soon as possible regarding your rash.  Return immediately to the emergency department if you experience any of the following: Rashes inside your mouth, fevers, large blisters, or any other concerning symptoms.    Thank you for visiting our Emergency Department. It was a pleasure taking care of you today.

## 2022-08-26 DIAGNOSIS — Z419 Encounter for procedure for purposes other than remedying health state, unspecified: Secondary | ICD-10-CM | POA: Diagnosis not present

## 2022-08-28 IMAGING — CR DG FOOT COMPLETE 3+V*L*
3 series · 3 of 3 positions shown · non-contrast
Comparison: Radiograph 11/23/2017

CLINICAL DATA: Accidentally kicked a chair with left big toe.

EXAM:
LEFT FOOT - COMPLETE 3+ VIEW

[foot ap]
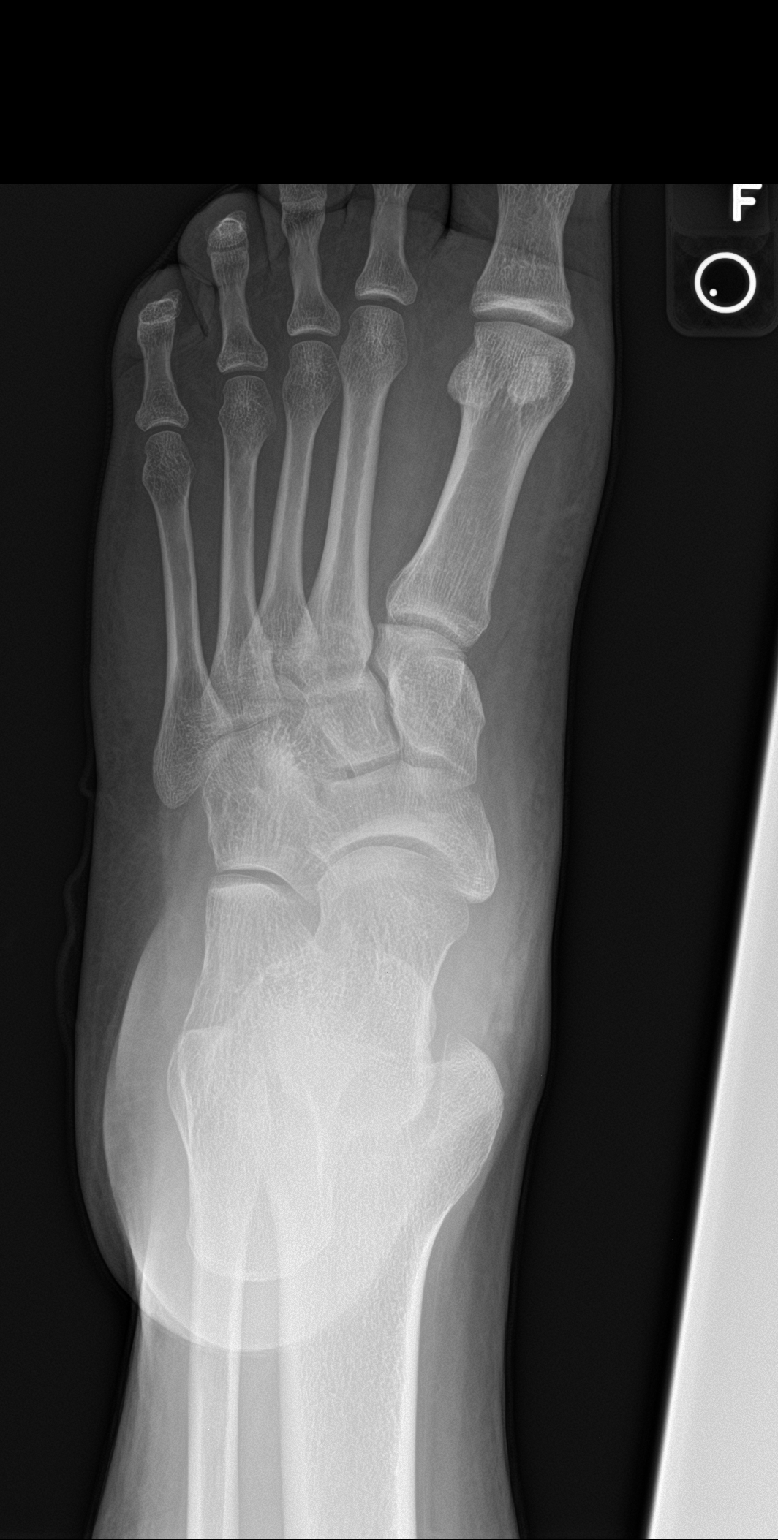

[foot lat]
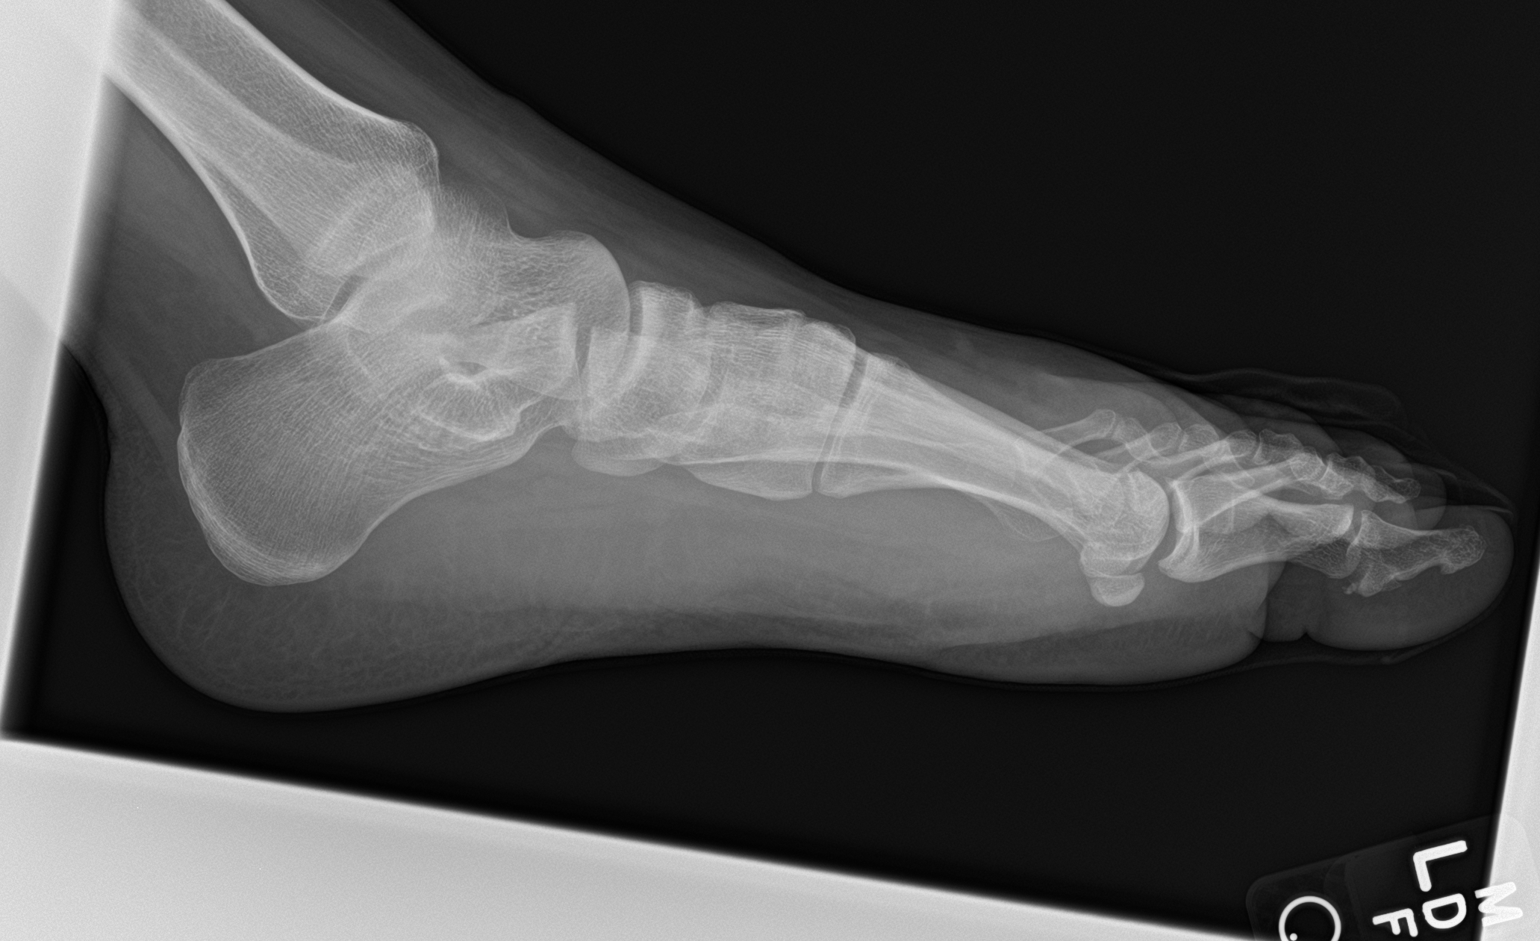

[foot obl]
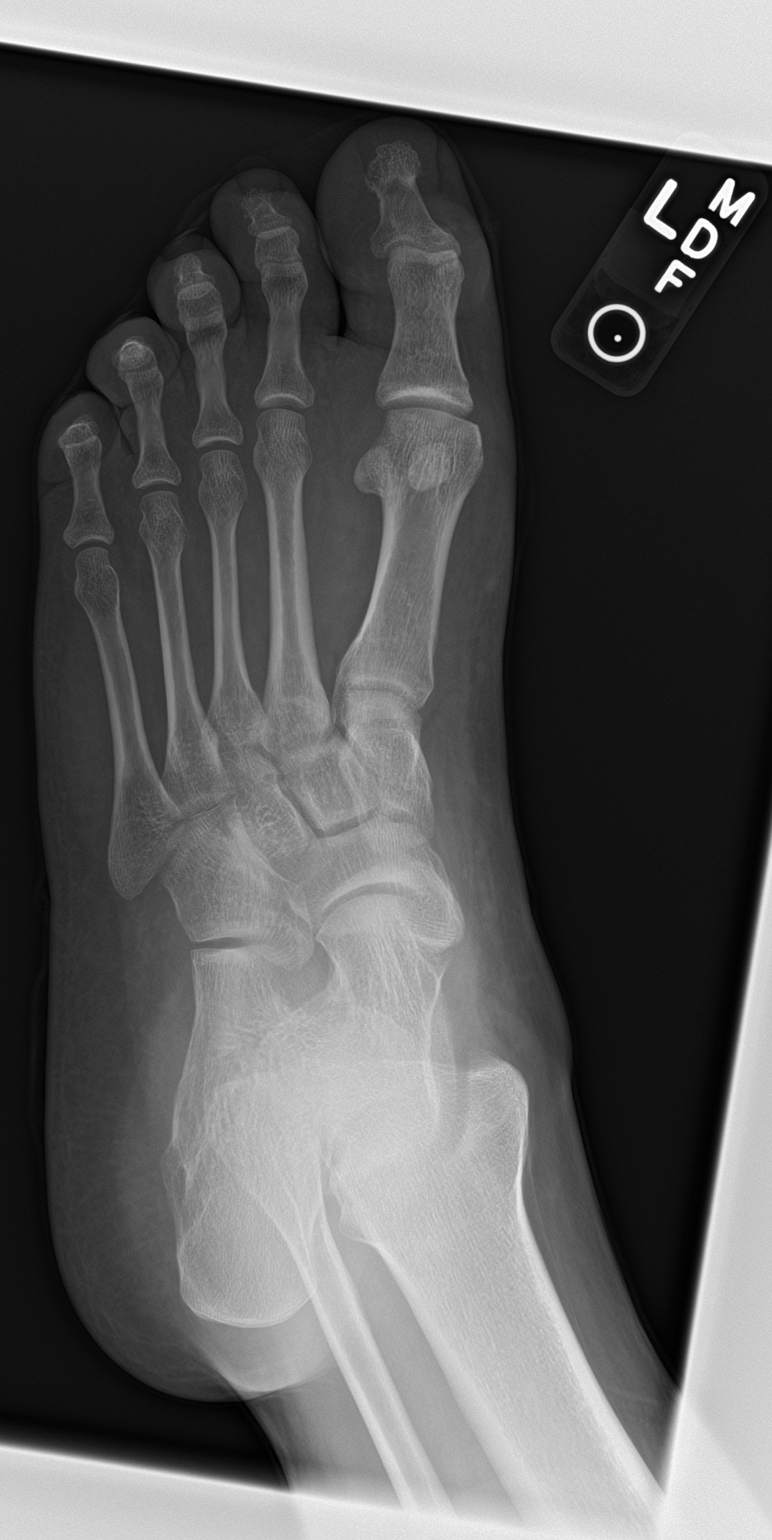

[3 of 3 positions shown; findings below may reference images not displayed]

FINDINGS: Suspected nondisplaced fracture of the great toe distal phalanx
medial aspect extending to the articular surface. This is best
appreciated on the lateral view. No other fracture of the foot.
Suggestion of pes planus on these nonweightbearing views. Otherwise
normal alignment. There is mild soft tissue edema of the forefoot.
IMPRESSION: Suspected nondisplaced great toe distal phalanx fracture extending
to the articular surface.

## 2022-09-26 DIAGNOSIS — Z419 Encounter for procedure for purposes other than remedying health state, unspecified: Secondary | ICD-10-CM | POA: Diagnosis not present
